# Patient Record
Sex: Female | Born: 1960
Health system: Southern US, Community
[De-identification: ages and names within clinical notes are randomized; demographics above are authoritative.]

## PROBLEM LIST (undated history)

## (undated) DIAGNOSIS — E079 Disorder of thyroid, unspecified: Secondary | ICD-10-CM

## (undated) DIAGNOSIS — L272 Dermatitis due to ingested food: Secondary | ICD-10-CM

## (undated) DIAGNOSIS — E785 Hyperlipidemia, unspecified: Secondary | ICD-10-CM

## (undated) DIAGNOSIS — F329 Major depressive disorder, single episode, unspecified: Secondary | ICD-10-CM

## (undated) DIAGNOSIS — F32A Depression, unspecified: Secondary | ICD-10-CM

## (undated) HISTORY — DX: Hyperlipidemia, unspecified: E78.5

## (undated) HISTORY — DX: Disorder of thyroid, unspecified: E07.9

## (undated) HISTORY — PX: TONSILLECTOMY AND ADENOIDECTOMY: SUR1326

## (undated) HISTORY — DX: Major depressive disorder, single episode, unspecified: F32.9

## (undated) HISTORY — DX: Depression, unspecified: F32.A

## (undated) HISTORY — DX: Dermatitis due to ingested food: L27.2

---

## 2003-11-24 ENCOUNTER — Other Ambulatory Visit: Admission: RE | Admit: 2003-11-24 | Discharge: 2003-11-24 | Payer: Self-pay | Admitting: Family Medicine

## 2008-01-29 LAB — CONVERTED CEMR LAB: Pap Smear: NORMAL

## 2008-07-21 ENCOUNTER — Encounter: Payer: Self-pay | Admitting: Family Medicine

## 2008-08-06 ENCOUNTER — Encounter: Payer: Self-pay | Admitting: Family Medicine

## 2008-08-10 ENCOUNTER — Encounter: Payer: Self-pay | Admitting: Internal Medicine

## 2008-12-02 ENCOUNTER — Encounter: Payer: Self-pay | Admitting: Family Medicine

## 2009-02-10 ENCOUNTER — Ambulatory Visit: Payer: Self-pay | Admitting: Family Medicine

## 2009-02-10 DIAGNOSIS — L272 Dermatitis due to ingested food: Secondary | ICD-10-CM | POA: Insufficient documentation

## 2009-02-10 DIAGNOSIS — E039 Hypothyroidism, unspecified: Secondary | ICD-10-CM | POA: Insufficient documentation

## 2009-02-14 ENCOUNTER — Encounter: Payer: Self-pay | Admitting: Family Medicine

## 2009-02-14 ENCOUNTER — Ambulatory Visit: Payer: Self-pay | Admitting: Family Medicine

## 2009-02-14 DIAGNOSIS — E559 Vitamin D deficiency, unspecified: Secondary | ICD-10-CM | POA: Insufficient documentation

## 2009-02-15 LAB — CONVERTED CEMR LAB
Alkaline Phosphatase: 52 units/L (ref 39–117)
Bilirubin, Direct: 0 mg/dL (ref 0.0–0.3)
Calcium: 8.8 mg/dL (ref 8.4–10.5)
Cholesterol: 231 mg/dL — ABNORMAL HIGH (ref 0–200)
Creatinine, Ser: 0.7 mg/dL (ref 0.4–1.2)
Direct LDL: 152.2 mg/dL
GFR calc non Af Amer: 94.41 mL/min (ref >60)
HDL: 44.6 mg/dL (ref >39.00)
Sodium: 138 meq/L (ref 135–145)
T4, Total: 6.7 ug/dL (ref 5.0–12.5)
Total Bilirubin: 0.6 mg/dL (ref 0.3–1.2)
Total CHOL/HDL Ratio: 5
Total Protein: 7.8 g/dL (ref 6.0–8.3)
Triglycerides: 190 mg/dL — ABNORMAL HIGH (ref 0.0–149.0)

## 2009-02-17 ENCOUNTER — Telehealth: Payer: Self-pay | Admitting: Family Medicine

## 2009-02-21 ENCOUNTER — Telehealth: Payer: Self-pay | Admitting: Family Medicine

## 2009-02-23 ENCOUNTER — Telehealth (INDEPENDENT_AMBULATORY_CARE_PROVIDER_SITE_OTHER): Payer: Self-pay | Admitting: *Deleted

## 2009-03-14 ENCOUNTER — Ambulatory Visit: Payer: Self-pay | Admitting: Internal Medicine

## 2009-03-14 DIAGNOSIS — E785 Hyperlipidemia, unspecified: Secondary | ICD-10-CM | POA: Insufficient documentation

## 2009-03-22 ENCOUNTER — Telehealth: Payer: Self-pay | Admitting: Internal Medicine

## 2009-03-23 LAB — CONVERTED CEMR LAB
Basophils Relative: 0.3 % (ref 0.0–3.0)
HCT: 39.9 % (ref 36.0–46.0)
Hemoglobin: 13.3 g/dL (ref 12.0–15.0)
Lymphocytes Relative: 26.6 % (ref 12.0–46.0)
Lymphs Abs: 1.9 10*3/uL (ref 0.7–4.0)
MCHC: 33.2 g/dL (ref 30.0–36.0)
Monocytes Relative: 7.1 % (ref 3.0–12.0)
Neutro Abs: 4.7 10*3/uL (ref 1.4–7.7)
RBC: 4.54 M/uL (ref 3.87–5.11)
RDW: 12.9 % (ref 11.5–14.6)

## 2009-04-13 ENCOUNTER — Encounter: Payer: Self-pay | Admitting: Family Medicine

## 2009-07-11 ENCOUNTER — Ambulatory Visit: Payer: Self-pay | Admitting: Family Medicine

## 2009-08-08 ENCOUNTER — Ambulatory Visit: Payer: Self-pay | Admitting: Family Medicine

## 2009-08-08 LAB — CONVERTED CEMR LAB: TSH: 1.89 microintl units/mL (ref 0.35–5.50)

## 2009-08-09 LAB — CONVERTED CEMR LAB: Vit D, 25-Hydroxy: 24 ng/mL — ABNORMAL LOW (ref 30–89)

## 2009-09-21 ENCOUNTER — Ambulatory Visit: Payer: Self-pay | Admitting: Family Medicine

## 2009-09-26 ENCOUNTER — Telehealth: Payer: Self-pay | Admitting: Family Medicine

## 2009-10-25 ENCOUNTER — Ambulatory Visit: Payer: Self-pay | Admitting: Family Medicine

## 2009-10-28 LAB — CONVERTED CEMR LAB: Vit D, 25-Hydroxy: 29 ng/mL — ABNORMAL LOW (ref 30–89)

## 2009-12-07 ENCOUNTER — Encounter (INDEPENDENT_AMBULATORY_CARE_PROVIDER_SITE_OTHER): Payer: Self-pay | Admitting: *Deleted

## 2009-12-07 ENCOUNTER — Ambulatory Visit: Payer: Self-pay | Admitting: Family Medicine

## 2009-12-07 ENCOUNTER — Other Ambulatory Visit: Admission: RE | Admit: 2009-12-07 | Discharge: 2009-12-07 | Payer: Self-pay | Admitting: Family Medicine

## 2009-12-07 LAB — HM PAP SMEAR

## 2009-12-08 ENCOUNTER — Encounter: Payer: Self-pay | Admitting: Family Medicine

## 2009-12-08 LAB — CONVERTED CEMR LAB
ALT: 27 units/L (ref 0–35)
AST: 20 units/L (ref 0–37)
Alkaline Phosphatase: 56 units/L (ref 39–117)
BUN: 9 mg/dL (ref 6–23)
Bilirubin, Direct: 0 mg/dL (ref 0.0–0.3)
Calcium: 9 mg/dL (ref 8.4–10.5)
GFR calc non Af Amer: 81.85 mL/min (ref >60)
Glucose, Bld: 112 mg/dL — ABNORMAL HIGH (ref 70–99)
Total Bilirubin: 0.4 mg/dL (ref 0.3–1.2)

## 2009-12-15 ENCOUNTER — Encounter: Payer: Self-pay | Admitting: Family Medicine

## 2009-12-28 ENCOUNTER — Telehealth: Payer: Self-pay | Admitting: Family Medicine

## 2010-01-16 ENCOUNTER — Encounter (INDEPENDENT_AMBULATORY_CARE_PROVIDER_SITE_OTHER): Payer: Self-pay | Admitting: *Deleted

## 2010-01-17 ENCOUNTER — Ambulatory Visit: Payer: Self-pay | Admitting: Internal Medicine

## 2010-01-31 ENCOUNTER — Ambulatory Visit: Payer: Self-pay | Admitting: Internal Medicine

## 2010-02-03 LAB — HM MAMMOGRAPHY: HM Mammogram: NORMAL

## 2010-02-06 ENCOUNTER — Encounter: Payer: Self-pay | Admitting: Family Medicine

## 2010-03-21 NOTE — Letter (Signed)
Summary: Results Follow up Letter  [facility]  [address] [place], [place] [address]   Phone: [phone]  Fax: [phone]    12/15/2009 MRN: [id number]  [facility] [address] [place], [place]  [address]  Dear Ms. Leandro Reasoner,  The following are the results of your recent test(s):  Test         Result    Pap Smear:        Normal __X___  Not Normal _____ Comments: ______________________________________________________ Cholesterol: LDL(Bad cholesterol):         Your goal is less than:         HDL (Good cholesterol):       Your goal is more than: Comments:  ______________________________________________________ Mammogram:        Normal _____  Not Normal _____ Comments:  ___________________________________________________________________ Hemoccult:        Normal _____  Not normal _______ Comments:    _____________________________________________________________________ Other Tests:    We routinely do not discuss normal results over the telephone.  If you desire a copy of the results, or you have any questions about this information we can discuss them at your next office visit.   Sincerely,     Linde Gillis, CMA (AAMA)for Dr. Ruthe Mannan

## 2010-03-21 NOTE — Assessment & Plan Note (Signed)
Summary: thyroid medication/dlo   Vital Signs:  Patient profile:   50 year old female Height:      66 inches Weight:      224.13 pounds BMI:     36.31 Temp:     98.1 degrees F oral Pulse rate:   68 / minute Pulse rhythm:   regular BP sitting:   118 / 72  (left arm) Cuff size:   large  Vitals Entered By: Linde Gillis CMA Duncan Dull) (Jul 11, 2009 12:00 PM) CC: thyroid medication   History of Present Illness: 50 yo here for hypothyroidism.  Hypothyroidism- had been on Armour 75 mg for years, then on Armour 30 mg daily. Referred to endocrinology, Dr. Sharl Ma. He placed her on Synthroid 50 micrograms daily.   Since starting the Synthroid, feels terrible. Per pt, told Dr. Sharl Ma but he said her blood work was normal and did not want to increase dose. Currently fatigued, constipated, cold, feels her skin is dry.    Current Medications (verified): 1)  Vitamin D3 10000 Unit Caps (Cholecalciferol) .Marland Kitchen.. 1 Tab By Mouth Daily. 2)  Armour Thyroid 30 Mg Tabs (Thyroid) .... Take 1 By Mouth Once Daily 3)  Fexofenadine Hcl 180 Mg Tabs (Fexofenadine Hcl) .Marland Kitchen.. 1 Daily As Needed Antihistamine 4)  Zantac 25 Mg/ml Soln (Ranitidine Hcl) .... Take One Tablet By Mouth As Needed For Heatburn  Allergies: 1)  ! Penicillin V Potassium (Penicillin V Potassium) 2)  ! Steroids  Review of Systems      See HPI General:  Complains of malaise. CV:  Denies chest pain or discomfort. Resp:  Denies shortness of breath. GI:  Complains of constipation.  Physical Exam  General:  Well-developed,well-nourished,in no acute distress; alert,appropriate and cooperative throughout examination Lungs:  Normal respiratory effort, chest expands symmetrically. Lungs are clear to auscultation, no crackles or wheezes. Heart:  Normal rate and regular rhythm. S1 and S2 normal without gallop, murmur, click, rub or other extra sounds. Psych:  normally interactive and good eye contact.     Impression & Recommendations:  Problem #  1:  HYPOTHYROIDISM (ICD-244.9) Assessment Deteriorated Time spent with patient 25 minutes, more than 50% of this time was spent counseling patient on hypothyroidism. Will restart Armour, see pt instructions for details. Follow up with me in one month, will check thyroid funciton at that time.  Her updated medication list for this problem includes:    Armour Thyroid 30 Mg Tabs (Thyroid) .Marland Kitchen... Take 1 by mouth once daily  Complete Medication List: 1)  Vitamin D3 10000 Unit Caps (Cholecalciferol) .Marland Kitchen.. 1 tab by mouth daily. 2)  Armour Thyroid 30 Mg Tabs (Thyroid) .... Take 1 by mouth once daily 3)  Fexofenadine Hcl 180 Mg Tabs (Fexofenadine hcl) .Marland Kitchen.. 1 daily as needed antihistamine 4)  Zantac 25 Mg/ml Soln (Ranitidine hcl) .... Take one tablet by mouth as needed for heatburn  Patient Instructions: 1)  Let's taper synthroid- 1 tab every other day for 2-3 weeks with the  armour. 2)  come back in one month to recheck and symptoms. Prescriptions: ARMOUR THYROID 30 MG TABS (THYROID) take 1 by mouth once daily  #30 x 12   Entered and Authorized by:   Ruthe Mannan MD   Signed by:   Ruthe Mannan MD on 07/11/2009   Method used:   Electronically to        [id number]* (retail)       [address]       [place],  [place]  [address]       Ph: [phone]       Fax: [phone]   RxID:   [id number]   Current Allergies (reviewed today): ! PENICILLIN V POTASSIUM (PENICILLIN V POTASSIUM) ! STEROIDS

## 2010-03-21 NOTE — Letter (Signed)
Summary: Pre Visit Letter Revised  [facility]  [address] [place], [place] [address]   Phone: [phone]  Fax: [phone]        12/07/2009 MRN: [id number] [facility] [address] [place], [place]  [address]             Procedure Date:  01-31-10   Welcome to the Gastroenterology Division at [facility].    You are scheduled to see a nurse for your pre-procedure visit on 01-17-10 at 10:00a.m. on the 3rd floor at [facility], [address]  We ask that you try to arrive at our office 15 minutes prior to your appointment time to allow for check-in.  Please take a minute to review the attached form.  If you answer "Yes" to one or more of the questions on the first page, we ask that you call the person listed at your earliest opportunity.  If you answer "No" to all of the questions, please complete the rest of the form and bring it to your appointment.    Your nurse visit will consist of discussing your medical and surgical history, your immediate family medical history, and your medications.   If you are unable to list all of your medications on the form, please bring the medication bottles to your appointment and we will list them.  We will need to be aware of both prescribed and over the counter drugs.  We will need to know exact dosage information as well.    Please be prepared to read and sign documents such as consent forms, a financial agreement, and acknowledgement forms.  If necessary, and with your consent, a friend or relative is welcome to sit-in on the nurse visit with you.  Please bring your insurance card so that we may make a copy of it.  If your insurance requires a referral to see a specialist, please bring your referral form from your primary care physician.  No co-pay is required for this nurse visit.     If you cannot keep your appointment, please call [phone] to cancel or reschedule prior to your appointment date.  This  allows [place] the opportunity to schedule an appointment for another patient in need of care.    Thank you for choosing [facility] for your medical needs.  We appreciate the opportunity to care for you.  Please visit [place] at our website  to learn more about our practice.  Sincerely, The Gastroenterology Division

## 2010-03-21 NOTE — Miscellaneous (Signed)
Summary: [facility]  Clinical Lists Changes  Medications: Added new medication of MIRALAX   POWD (POLYETHYLENE GLYCOL 3350) As per prep  instructions. - Signed Added new medication of DULCOLAX 5 MG  TBEC (BISACODYL) Day before procedure take 2 at 3pm and 2 at 8pm. - Signed Added new medication of REGLAN 10 MG  TABS (METOCLOPRAMIDE HCL) As per prep instructions. - Signed Rx of MIRALAX   POWD (POLYETHYLENE GLYCOL 3350) As per prep  instructions.;  #255gm x 0;  Signed;  Entered by: Ezra Sites RN;  Authorized by: Hart Carwin MD;  Method used: Electronically to [facility] # [address], [place], [place]  [address], Ph: [phone], Fax: [phone] Rx of DULCOLAX 5 MG  TBEC (BISACODYL) Day before procedure take 2 at 3pm and 2 at 8pm.;  #4 x 0;  Signed;  Entered by: Ezra Sites RN;  Authorized by: Hart Carwin MD;  Method used: Electronically to [facility] # [address]*, [address], [place], [place]  [address], Ph: [phone], Fax: [phone] Rx of REGLAN 10 MG  TABS (METOCLOPRAMIDE HCL) As per prep instructions.;  #2 x 0;  Signed;  Entered by: Ezra Sites RN;  Authorized by: Hart Carwin MD;  Method used: Electronically to [facility] # [address], [place], [place]  [address], Ph: [phone], Fax: [phone] Allergies: Changed allergy or adverse reaction from PENICILLIN V POTASSIUM (PENICILLIN V POTASSIUM) to PENICILLIN V POTASSIUM (PENICILLIN V POTASSIUM) Changed allergy or adverse reaction from STEROIDS to STEROIDS    Prescriptions: REGLAN 10 MG  TABS (METOCLOPRAMIDE HCL) As per prep instructions.  #2 x 0   Entered by:   Ezra Sites RN   Authorized by:   Hart Carwin MD   Signed by:   Ezra Sites RN on 01/17/2010   Method used:   Electronically to        [facility] [address]* (retail)       [address]       [place], [place]  [address]       Ph: [phone]       Fax: [phone]   RxID:    [id number] DULCOLAX 5 MG  TBEC (BISACODYL) Day before procedure take 2 at 3pm and 2 at 8pm.  #4 x 0   Entered by:   Ezra Sites RN   Authorized by:   Hart Carwin MD   Signed by:   Ezra Sites RN on 01/17/2010   Method used:   Electronically to        [id number]* (retail)       [address]       [place], [place]  [address]       Ph: [phone]       Fax: [phone]   RxID:   [id number] MIRALAX   POWD (POLYETHYLENE GLYCOL 3350) As per prep  instructions.  #255gm x 0   Entered by:   Ezra Sites RN   Authorized by:   Hart Carwin MD   Signed by:   Ezra Sites RN on 01/17/2010   Method used:   Electronically to        [facility] # [address]* (retail)       [address]       [place], [place]  [address]       Ph: [phone]       Fax: [phone]   RxID:  [id number]  

## 2010-03-21 NOTE — Assessment & Plan Note (Signed)
Summary: allergy problem   Vital Signs:  Patient profile:   50 year old female Height:      66 inches Weight:      229.38 pounds BMI:     37.16 O2 Sat:      98 % on Room air Pulse rate:   75 / minute BP sitting:   122 / 68  (left arm) Cuff size:   large  Vitals Entered By: Gweneth Dimitri RN (March 14, 2009 3:36 PM)  O2 Flow:  Room air CC: Allergy Consult. Comments Medications reviewed with patient Daytime contact number verified with patient. Gweneth Dimitri RN  March 14, 2009 3:36 PM    Copy to:  Dr. Dayton Martes Primary Provider/Referring Provider:  Dr. Dayton Martes  CC:  Allergy Consult.Marland Kitchen  History of Present Illness: March 14, 2009- 50 yoF seen in allergy referral at kind request of Dr Dayton Martes, concerned about food intolerances. In the past year for the first time, she has had several episodes of urticaria with swelling of lips. She has had allergy skin testing twice, by Dr Lucie Leather and Dr Yisroel Ramming. These showed reaction to several common inhalants. Testing last June showed positive responses to corn, almond, peanut, cantaloupe, cotton seed and rye. Allegra helps. She is concerned, despite prior testing, that something is being missed. She understands the distinction between allergy and intolerance. She feels better if she avoids wheat, but says test for Celiac was neg. Also soy maakes her "tired". She has tried a food diary. She denies significant rhinitis from pollens or dust. Sensitive to strong odors. Hx of depression.  Denies unusual responses to latex, contrast dye, insect stings or aspirin.  Preventive Screening-Counseling & Management  Alcohol-Tobacco     Smoking Status: quit  Current Medications (verified): 1)  Prilosec Otc 20 Mg Tbec (Omeprazole Magnesium) .... One By Mouth Daily 2)  Vitamin D3 10000 Unit Caps (Cholecalciferol) .Marland Kitchen.. 1 Tab By Mouth Daily. 3)  Armour Thyroid 30 Mg Tabs (Thyroid) .... Take 1 By Mouth Once Daily  Allergies (verified): 1)  ! Penicillin V Potassium  (Penicillin V Potassium) 2)  ! 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  Quit in 2007.  1ppd x 30 yrs.  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  Quit in 2007.  1ppd x 30 yrs.  alcohol 2 drinks/month Smoking Status:  quit  Review of Systems      See HPI  The patient denies shortness of breath with activity, shortness of breath at rest, productive cough, non-productive cough, coughing up blood, chest pain, irregular heartbeats, acid heartburn, indigestion, loss of appetite, weight change, abdominal pain, difficulty swallowing, sore throat, tooth/dental problems, headaches, nasal congestion/difficulty breathing through nose, sneezing, itching, ear ache, anxiety, depression, hand/feet swelling, joint stiffness or pain, rash, change in color of mucus, and fever.    Physical Exam  Additional Exam:  General: A/Ox3; pleasant and cooperative, NAD, overweight SKIN: no  rash, lesions NODES: no lymphadenopathy HEENT: [place]/AT, EOM- WNL, Conjuctivae- clear, PERRLA, TM-WNL, Nose- clear, Throat- clear and wnl, Vocal tremor, NECK: Supple w/ fair ROM, JVD- none, normal carotid impulses w/o bruits Thyroid- normal to palpation CHEST: Clear to P&A HEART: RRR, no  m/g/r heard ABDOMEN: Soft and nl; nml bowel sounds; no organomegaly or masses noted EAV:WUJW, nl pulses, no edema  NEURO: Grossly intact to observation      Impression & Recommendations:  Problem # 1:  ALLERGY, FOOD (ICD-693.1) She has had a few episodes of mild urticaria/ angioedema without compelling relationship to foods. She is convinced that foods, especially wheat, have been causing hives and lip swelling, with little GI distress beyond what may  be mild irritable bowel. Thyroid w/u is pending. We will evaluate food sensitivity with IgE, IgG and celiac evaluation. I have re-emphazsized use of food diary, antihistamines and avoidance of foods she most suspects.  Medications Added to Medication List This Visit: 1)  Fexofenadine Hcl 180 Mg Tabs (Fexofenadine hcl) .Marland Kitchen.. 1 daily as needed antihistamine  Other Orders: Consultation Level III (11914) TLB-CBC Platelet - w/Differential (85025-CBCD) T- * Misc. Laboratory test [id number])  Patient Instructions: 1)  Please schedule a follow-up appointment in 3 weeks. 2)  Lab 3)  Avoid foods that seem to make you sick 4)  It is ok to pretreat with an antihistamine before going out or when eating foods you aren't sure about. 5)  Try to keep a food diary. 6)  Script for fexofenadine 180 Prescriptions: FEXOFENADINE HCL 180 MG TABS (FEXOFENADINE HCL) 1 daily as needed antihistamine  #30 x prn   Entered and Authorized by:   Waymon Budge MD   Signed by:   Waymon Budge MD on 03/14/2009   Method used:   Print then Give to Patient   RxID:   [id number]

## 2010-03-21 NOTE — Miscellaneous (Signed)
Summary: Skin Test/[facility]  [facility]   Imported By: Sherian Rein 03/25/2009 07:31:32  _____________________________________________________________________  External Attachment:    Type:   Image     Comment:   External Document

## 2010-03-21 NOTE — Consult Note (Signed)
Summary: [facility]   Imported By: Lanelle Bal 05/03/2009 08:27:26  _____________________________________________________________________  External Attachment:    Type:   Image     Comment:   External Document

## 2010-03-21 NOTE — Letter (Signed)
Summary: Generic Letter  [facility]  [address] [place], [place] [address]   Phone: [phone]  Fax: [phone]    12/08/2009  KATERINA ZURN [address] [place], [place]  [address]  Dear Ms. Leandro Reasoner,     We have received your lab results and Dr. Dayton Martes says that overall your labs look good.  Thyroid function good.  Good cholesterol, (HDL) and triglycerides are good but bad cholesterol, (LDL) is high.  Decrease added sugars, eliminate trans fats, increase fiber.  If you feel you cannot make too many dietary changes due to your sensitive GI tract, we will need to start cholesterol medication.       Sincerely,        Linde Gillis CMA (AAMA)for Dr. Ruthe Mannan

## 2010-03-21 NOTE — Progress Notes (Signed)
Summary: pt requests phone call  Phone Note Call from Patient Call back at [phone]   Caller: Patient Call For: Ruthe Mannan MD Summary of Call: Pt is  asking that you call her regarding her vitamin d level.  She is concerned that it didnt come up from june till now. Initial call taken by: Lowella Petties CMA,  September 26, 2009 9:46 AM  Follow-up for Phone Call        Discussed with pt reasons for difficulty absorbing vitamin d. Ruthe Mannan MD  September 26, 2009 9:53 AM     New/Updated Medications: ARMOUR THYROID 60 MG TABS (THYROID) 1 tab by mouth daily Prescriptions: ARMOUR THYROID 60 MG TABS (THYROID) 1 tab by mouth daily  #30 x 0   Entered and Authorized by:   Ruthe Mannan MD   Signed by:   Ruthe Mannan MD on 09/26/2009   Method used:   Electronically to        [facility] # [address] (retail)       [address]       [place], [place]  [address]       Ph: [phone]       Fax: [phone]   RxID:   [id number]

## 2010-03-21 NOTE — Progress Notes (Signed)
Summary: ? about Vit D3 rx  Phone Note Call from Patient Call back at [phone]   Caller: Patient Call For: Ruthe Mannan MD Summary of Call: [facility] [address] [facility] told did not have [facility] her. I [facility] [address] and spoke with Leah and she said they did receive the rx for Vit D3 10,000 units. [facility] and [facility] with [facility]. Initial call taken by: Lewanda Rife LPN,  February 21, 2009 1:09 PM

## 2010-03-21 NOTE — Assessment & Plan Note (Signed)
Summary: 1 M F/U DLO   Vital Signs:  Patient profile:   50 year old female Height:      66 inches Weight:      220.38 pounds BMI:     35.70 Temp:     97.8 degrees F oral Pulse rate:   68 / minute Pulse rhythm:   regular BP sitting:   126 / 86  (left arm) Cuff size:   large  Vitals Entered By: Linde Gillis CMA Duncan Dull) (August 08, 2009 11:52 AM) CC: one month follow up    History of Present Illness: 50 yo here for hypothyroidism.  Hypothyroidism- had been on Armour 75 mg for years, then on Armour 30 mg daily. Referred to endocrinology, Dr. Sharl Ma. He placed her on Synthroid 50 micrograms daily.   Since starting the Synthroid, feels terrible. Per pt, told Dr. Sharl Ma but he said her blood work was normal and did not want to increase dose. Last month, we restarted her Armour 30 mg daily and stopped the Synthroid. She does feel much better but still is tired and has dry skin.  Hair continues to fall out although not as rapidly.    Current Medications (verified): 1)  Vitamin D3 10000 Unit Caps (Cholecalciferol) .Marland Kitchen.. 1 Tab By Mouth Daily. 2)  Armour Thyroid 30 Mg Tabs (Thyroid) .... Take 1 By Mouth Once Daily 3)  Fexofenadine Hcl 180 Mg Tabs (Fexofenadine Hcl) .Marland Kitchen.. 1 Daily As Needed Antihistamine 4)  Zantac 25 Mg/ml Soln (Ranitidine Hcl) .... Take One Tablet By Mouth As Needed For Heatburn  Allergies: 1)  ! Penicillin V Potassium (Penicillin V Potassium) 2)  ! Steroids  Past History:  Past Medical History: Last updated: 03/14/2009 Hypothyroidism : HYPERLIPIDEMIA (ICD-272.4) VITAMIN D DEFICIENCY (ICD-268.9) ROUTINE GENERAL MEDICAL EXAM@HEALTH  CARE FACL (ICD-V70.0) SCREENING FOR LIPOID DISORDERS (ICD-V77.91) HYPOTHYROIDISM (ICD-244.9) UNSPECIFIED HYPOTHYROIDISM (ICD-244.9) ALLERGY, FOOD (ICD-693.1) Depression  Past Surgical History: Last updated: 03/14/2009 tonsils  Family History: Last updated: 02-22-09 Mom -alive, 36, HLD Dad died of lung CA, had MI at age  87  Social History: Last updated: 03/14/2009 Lives with husband in [place], no children She is a Veterinary surgeon, 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  Quit in 2007.  1ppd x 30 yrs.  alcohol 2 drinks/month  Risk Factors: Smoking Status: quit (03/14/2009)  Review of Systems      See HPI General:  Complains of fatigue. ENT:  Denies difficulty swallowing. CV:  Denies chest pain or discomfort. Resp:  Denies shortness of breath. GI:  Denies abdominal pain and change in bowel habits. Derm:  Complains of dryness and hair loss. Psych:  Denies anxiety and depression.  Physical Exam  General:  Well-developed,well-nourished,in no acute distress; alert,appropriate and cooperative throughout examination Mouth:  Oral mucosa and oropharynx without lesions or exudates.  Teeth in good repair. Neck:  No deformities, masses, or tenderness noted. Lungs:  Normal respiratory effort, chest expands symmetrically. Lungs are clear to auscultation, no crackles or wheezes. Heart:  Normal rate and regular rhythm. S1 and S2 normal without gallop, murmur, click, rub or other extra sounds. Extremities:  No clubbing, cyanosis, edema, or deformity noted with normal full range of motion of all joints.   Psych:  normally interactive and good eye contact.     Impression & Recommendations:  Problem # 1:  HYPOTHYROIDISM (ICD-244.9) Assessment Unchanged Time spent with patient 25 minutes, more than 50% of this time was spent counseling patient on hypothyroidism. Will recheck TSH today although we need to manage her symptoms  mainly since her labs tend to be normal. Also, she does have a h/o Vit D deficiency.  Will recheck Vit D.  May need short course of higher dose Vit D supplementation. Pt in agreement with plan.  Her updated medication list for this problem includes:    Armour Thyroid 30 Mg Tabs (Thyroid) .Marland Kitchen... Take 1 by mouth once daily  Orders: Venipuncture (42595) TLB-TSH (Thyroid Stimulating  Hormone) (84443-TSH)  Complete Medication List: 1)  Vitamin D3 10000 Unit Caps (Cholecalciferol) .Marland Kitchen.. 1 tab by mouth daily. 2)  Armour Thyroid 30 Mg Tabs (Thyroid) .... Take 1 by mouth once daily 3)  Fexofenadine Hcl 180 Mg Tabs (Fexofenadine hcl) .Marland Kitchen.. 1 daily as needed antihistamine 4)  Zantac 25 Mg/ml Soln (Ranitidine hcl) .... Take one tablet by mouth as needed for heatburn  Other Orders: T-Vitamin D (25-Hydroxy) ([id number])  Current Allergies (reviewed today): ! PENICILLIN V POTASSIUM (PENICILLIN V POTASSIUM) ! STEROIDS  Appended Document: 1 M F/U DLO

## 2010-03-21 NOTE — Progress Notes (Signed)
Summary: Referral  Phone Note Call from Patient Call back at [phone]   Caller: Patient Call For: Tiffany Schmidt Summary of Call: pt would like to know if her Endocrinology appt has been set up? Initial call taken by: Mervin Hack CMA Duncan Dull),  February 23, 2009 2:24 PM  Follow-up for Phone Call        I see in pt's chart that it was done and sent, but is there a date or should pt just wait for that office to call her? DeShannon Katrinka Blazing CMA Duncan Dull)  February 23, 2009 2:24 PM   Additional Follow-up for Phone Call Additional follow up Details #1::        Sp w/ Pt regarding appt.Marland KitchenMarland KitchenPer Dr. Dayton Martes would like pt scheduled w/ Dr. Sharl Ma.Tiffany Schmidt  February 24, 2009 2:16 PM  Additional Follow-up by: Tiffany Schmidt,  February 24, 2009 2:16 PM    New/Updated Medications: ARMOUR THYROID 30 MG TABS (THYROID) take 1 by mouth once daily Prescriptions: ARMOUR THYROID 30 MG TABS (THYROID) take 1 by mouth once daily  #30 x 12   Entered by:   Mervin Hack CMA (AAMA)   Authorized by:   Ruthe Mannan MD   Signed by:   Mervin Hack CMA (AAMA) on 02/23/2009   Method used:   Electronically to        [facility] # [address] (retail)       [address]       [place], [place]  [address]       Ph: [phone]       Fax: [phone]   RxID:   [id number]

## 2010-03-21 NOTE — Progress Notes (Signed)
Summary: regarding armour thyroid  Phone Note Call from Patient Call back at [phone]   Caller: Patient Call For: Tiffany Mannan MD Summary of Call: Pt called complaining of headache, sore throat, low grade fever since this morning.  She wanted script for tamiflu but I told her she would need to be seen first.  She said she will see how she feels tomorrow and will call back if not better.  Also, she said that the last time she got her armour thyroid refill it was for 30 mg's and she has been taking 60 mg's everyday for 2 weeks and feels fine.  Please advise on what dose she is to be on. Initial call taken by: Lowella Petties CMA, AAMA,  December 28, 2009 3:18 PM  Follow-up for Phone Call        Is she asking if we can increase to 60 mg?  I am ok with that. Tiffany Mannan MD  December 29, 2009 7:50 AM  Spoke with patient, she is feeling a little better and I verified her armour thyroid dose, she is taking 60mg  once daily.  I will send in a new Rx so she won't run out before the end of the month and so she won't have to take but one tablet daily. Follow-up by: Linde Gillis CMA Duncan Dull),  December 29, 2009 2:23 PM    New/Updated Medications: ARMOUR THYROID 60 MG TABS (THYROID) take one tablet by mouth daily Prescriptions: ARMOUR THYROID 60 MG TABS (THYROID) take one tablet by mouth daily  #30 x 11   Entered by:   Linde Gillis CMA (AAMA)   Authorized by:   Tiffany Mannan MD   Signed by:   Linde Gillis CMA (AAMA) on 12/29/2009   Method used:   Electronically to        [facility] # [address] (retail)       [address]       [place], [place]  [address]       Ph: [phone]       Fax: [phone]   RxID:   [id number]   Current Allergies (reviewed today): ! PENICILLIN V POTASSIUM (PENICILLIN V POTASSIUM) ! STEROIDS

## 2010-03-21 NOTE — Letter (Signed)
Summary: [facility] Instructions  [facility]  [address] [place], [place] [address]   Phone: [phone]  Fax: [phone]       Tiffany Schmidt    02-28-1960    MRN: [id number]       Procedure Day /Date: Tuesday 01-31-10     Arrival Time:  8:00 a.m.     Procedure Time: 9:00 a.m.     Location of Procedure:                    _x _  [facility] (4th Floor)   PREPARATION FOR COLONOSCOPY WITH MIRALAX  Starting 5 days prior to your procedure  01-26-10 do not eat nuts, seeds, popcorn, corn, beans, peas,  salads, or any raw vegetables.  Do not take any fiber supplements (e.g. Metamucil, Citrucel, and Benefiber). ____________________________________________________________________________________________________   THE DAY BEFORE YOUR PROCEDURE         DATE:  01-30-10  DAY:  Monday  1   Drink clear liquids the entire day-NO SOLID FOOD  2   Do not drink anything colored red or purple.  Avoid juices with pulp.  No orange juice.  3   Drink at least 64 oz. (8 glasses) of fluid/clear liquids during the day to prevent dehydration and help the prep work efficiently.  CLEAR LIQUIDS INCLUDE: Water Jello Ice Popsicles Tea (sugar ok, no milk/cream) Powdered fruit flavored drinks Coffee (sugar ok, no milk/cream) Gatorade Juice: apple, white grape, white cranberry  Lemonade Clear bullion, consomm, broth Carbonated beverages (any kind) Strained chicken noodle soup Hard Candy  4   Mix the entire bottle of Miralax with 64 oz. of Gatorade/Powerade in the morning and put in the refrigerator to chill.  5   At 3:00 pm take 2 Dulcolax/Bisacodyl tablets.  6   At 4:30 pm take one Reglan/Metoclopramide tablet.  7  Starting at 5:00 pm drink one 8 oz glass of the Miralax mixture every 15-20 minutes until you have finished drinking the entire 64 oz.  You should finish drinking prep around 7:30 or 8:00 pm.  8   If you are nauseated, you may take the 2nd  Reglan/Metoclopramide tablet at 6:30 pm.        9    At 8:00 pm take 2 more DULCOLAX/Bisacodyl tablets.     THE DAY OF YOUR PROCEDURE      DATE:   01-31-10   DAY:  Tuesday  You may drink clear liquids until  7:00 a.m.  (2 HOURS BEFORE PROCEDURE).   MEDICATION INSTRUCTIONS  Unless otherwise instructed, you should take regular prescription medications with a small sip of water as early as possible the morning of your procedure.          OTHER INSTRUCTIONS  You will need a responsible adult at least 51 years of age to accompany you and drive you home.   This person must remain in the waiting room during your procedure.  Wear loose fitting clothing that is easily removed.  Leave jewelry and other valuables at home.  However, you may wish to bring a book to read or an iPod/MP3 player to listen to music as you wait for your procedure to start.  Remove all body piercing jewelry and leave at home.  Total time from sign-in until discharge is approximately 2-3 hours.  You should go home directly after your procedure and rest.  You can resume normal activities the day after your procedure.  The day of your  procedure you should not:   Drive   Make legal decisions   Operate machinery   Drink alcohol   Return to work  You will receive specific instructions about eating, activities and medications before you leave.   The above instructions have been reviewed and explained to me by   Ezra Sites RN  January 17, 2010 10:16 AM     I fully understand and can verbalize these instructions _____________________________ Date _______

## 2010-03-21 NOTE — Miscellaneous (Signed)
Summary: [facility]   Imported By: Lester Spencer 04/07/2009 07:14:05  _____________________________________________________________________  External Attachment:    Type:   Image     Comment:   External Document

## 2010-03-21 NOTE — Progress Notes (Signed)
Summary: returning call to katie/cb  Phone Note Call from Patient Call back at Home Phone [phone]   Caller: Patient Call For: young Summary of Call: returning call to katie/cb Initial call taken by: Lacinda Axon,  March 22, 2009 10:25 AM  Follow-up for Phone Call        [facility]. see append for more information.Reynaldo Minium CMA  March 22, 2009 1:44 PM

## 2010-03-21 NOTE — Assessment & Plan Note (Signed)
Summary: CPX/CLE   Vital Signs:  Patient profile:   50 year old female Height:      66 inches Weight:      222 pounds BMI:     35.96 Temp:     97.6 degrees F oral Pulse rate:   72 / minute Pulse rhythm:   regular BP sitting:   122 / 80  (left arm) Cuff size:   large  Vitals Entered By: Linde Gillis CMA Duncan Dull) (December 07, 2009 8:20 AM) CC: complete physicial, with pap, refused flu shot   History of Present Illness: 50 yo here for CPX.  Hypothyroidism- had been on Armour 75 mg for years, then on Armour 30 mg daily. Referred to endocrinology, Dr. Sharl Ma. He placed her on Synthroid 50 micrograms daily which made her feel awful and shaky.  She still feels that way a little but its better.  Thinks she is starting menopause.  Has not had a period in 4 months, is having hot flashes at night.  Accupuncture is helping.     Current Medications (verified): 1)  Vitamin D (Ergocalciferol) 50000 Unit Caps (Ergocalciferol) .... Take One Tablet By Mouth Once A Week For Six Weeks 2)  Armour Thyroid 30 Mg Tabs (Thyroid) .... Take 1 By Mouth Once Daily 3)  Fexofenadine Hcl 180 Mg Tabs (Fexofenadine Hcl) .Marland Kitchen.. 1 Daily As Needed Antihistamine 4)  Zantac 25 Mg/ml Soln (Ranitidine Hcl) .... Take One Tablet By Mouth As Needed For Heatburn  Allergies: 1)  ! Penicillin V Potassium (Penicillin V Potassium) 2)  ! Steroids  Past History:  Past Medical History: Last updated: 03/14/2009 Hypothyroidism : HYPERLIPIDEMIA (ICD-272.4) VITAMIN D DEFICIENCY (ICD-268.9) ROUTINE GENERAL MEDICAL EXAM@HEALTH  CARE FACL (ICD-V70.0) SCREENING FOR LIPOID DISORDERS (ICD-V77.91) HYPOTHYROIDISM (ICD-244.9) UNSPECIFIED HYPOTHYROIDISM (ICD-244.9) ALLERGY, FOOD (ICD-693.1) Depression  Past Surgical History: Last updated: 03/14/2009 tonsils  Family History: Last updated: 02-12-2009 Mom -alive, 61, HLD Dad died of lung CA, had MI at age 49  Social History: Last updated: 03/14/2009 Lives with husband in  [place], no children She is a Veterinary surgeon, 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  Quit in 2007.  1ppd x 30 yrs.  alcohol 2 drinks/month  Risk Factors: Smoking Status: quit (03/14/2009)  Review of Systems      See HPI General:  Denies malaise. Eyes:  Denies blurring. ENT:  Denies difficulty swallowing. CV:  Denies chest pain or discomfort. Resp:  Denies shortness of breath. GI:  Denies abdominal pain, bloody stools, and change in bowel habits. GU:  Denies abnormal vaginal bleeding, discharge, and dysuria. MS:  Denies joint pain, joint redness, and joint swelling. Derm:  Denies rash. Neuro:  Denies headaches. Psych:  Denies suicidal thoughts/plans, thoughts of violence, unusual visions or sounds, and thoughts /plans of harming others. Endo:  Denies cold intolerance and heat intolerance. Heme:  Denies abnormal bruising and bleeding.  Physical Exam  General:  Well-developed,well-nourished,in no acute distress; alert,appropriate and cooperative throughout examination Head:  normocephalic and atraumatic.   Eyes:  No corneal or conjunctival inflammation noted. EOMI. Perrla. Funduscopic exam benign, without hemorrhages, exudates or papilledema. Vision grossly normal. Ears:  External ear exam shows no significant lesions or deformities.  Otoscopic examination reveals clear canals, tympanic membranes are intact bilaterally without bulging, retraction, inflammation or discharge. Hearing is grossly normal bilaterally. Nose:  External nasal examination shows no deformity or inflammation. Nasal mucosa are pink and moist without lesions or exudates. Mouth:  Oral mucosa and oropharynx without lesions or exudates.  Teeth in good repair.  Neck:  No deformities, masses, or tenderness noted. Breasts:  No mass, nodules, thickening, tenderness, bulging, retraction, inflamation, nipple discharge or skin changes noted.   Lungs:  Normal respiratory effort, chest expands symmetrically. Lungs are  clear to auscultation, no crackles or wheezes. Heart:  Normal rate and regular rhythm. S1 and S2 normal without gallop, murmur, click, rub or other extra sounds. Abdomen:  Bowel sounds positive,abdomen soft and non-tender without masses, organomegaly or hernias noted. Rectal:  no external abnormalities and no hemorrhoids.   Genitalia:  Pelvic Exam:        External: normal female genitalia without lesions or masses        Vagina: normal without lesions or masses        Cervix: normal without lesions or masses        Adnexa: normal bimanual exam without masses or fullness        Uterus: normal by palpation        Pap smear: performed Msk:  normal ROM, no joint tenderness, and no joint swelling.   Extremities:  no edema Neurologic:  alert & oriented X3 and cranial nerves II-XII intact.   Skin:  Intact without suspicious lesions or rashes Psych:  normally interactive and good eye contact.     Impression & Recommendations:  Problem # 1:  Preventive Health Care (ICD-V70.0) Reviewed preventive care protocols, scheduled due services, and updated immunizations Discussed nutrition, exercise, diet, and healthy lifestyle.  Pap smear today. FLP, BMET today. Set up mammogram and colonoscopy today.  Problem # 2:  HYPOTHYROIDISM (ICD-244.9) Assessment: Unchanged recheck TSH today. The following medications were removed from the medication list:    Armour Thyroid 60 Mg Tabs (Thyroid) .Marland Kitchen... 1 tab by mouth daily Her updated medication list for this problem includes:    Armour Thyroid 30 Mg Tabs (Thyroid) .Marland Kitchen... Take 1 by mouth once daily  Orders: Venipuncture ([address]) TLB-TSH (Thyroid Stimulating Hormone) (84443-TSH)  Complete Medication List: 1)  Vitamin D (ergocalciferol) 50000 Unit Caps (Ergocalciferol) .... Take one tablet by mouth once a week for six weeks 2)  Armour Thyroid 30 Mg Tabs (Thyroid) .... Take 1 by mouth once daily 3)  Fexofenadine Hcl 180 Mg Tabs (Fexofenadine hcl) .Marland Kitchen.. 1  daily as needed antihistamine 4)  Zantac 25 Mg/ml Soln (Ranitidine hcl) .... Take one tablet by mouth as needed for heatburn  Other Orders: Gastroenterology Referral (GI) TLB-Lipid Panel (80061-LIPID) TLB-Hepatic/Liver Function Pnl (80076-HEPATIC) TLB-CMP (Comprehensive Metabolic Pnl) (80053-COMP) Radiology Referral (Radiology)  Patient Instructions: 1)  Great to see you. 2)  Please stop by to see Shirlee Limerick on your way out.   Orders Added: 1)  Gastroenterology Referral [GI] 2)  TLB-Lipid Panel [80061-LIPID] 3)  TLB-Hepatic/Liver Function Pnl [80076-HEPATIC] 4)  TLB-CMP (Comprehensive Metabolic Pnl) [80053-COMP] 5)  Radiology Referral [Radiology] 6)  Venipuncture [60454] 7)  TLB-TSH (Thyroid Stimulating Hormone) [84443-TSH] 8)  Est. Patient 40-64 years [09811]    Current Allergies (reviewed today): ! PENICILLIN V POTASSIUM (PENICILLIN V POTASSIUM) ! STEROIDS Flu Vaccine Next Due:  Refused TD Next Due:  Refused     Prevention & Chronic Care Immunizations   Influenza vaccine: Not documented   Influenza vaccine deferral: Refused  (12/07/2009)   Influenza vaccine due: Refused  (12/07/2009)    Tetanus booster: Not documented   Td booster deferral: Refused  (12/07/2009)   Tetanus booster due: Refused  (12/07/2009)    Pneumococcal vaccine: Not documented  Colorectal Screening   Hemoccult: Not documented    Colonoscopy: Not documented  Colonoscopy action/deferral: GI referral  (12/07/2009)  Other Screening   Pap smear: normal  (01/29/2008)   Pap smear action/deferral: Ordered  (12/07/2009)   Pap smear due: 01/28/2010    Mammogram: normal  (02/03/2009)   Mammogram due: 02/03/2010   Smoking status: quit  (03/14/2009)  Lipids   Total Cholesterol: 231  (02/14/2009)   Lipid panel action/deferral: Lipid Panel ordered   LDL: Not documented   LDL Direct: 152.2  (02/14/2009)   HDL: 44.60  (02/14/2009)   Triglycerides: 190.0  (02/14/2009)    SGOT (AST): 21   (02/14/2009)   BMP action: Ordered   SGPT (ALT): 26  (02/14/2009) CMP ordered    Alkaline phosphatase: 52  (02/14/2009)   Total bilirubin: 0.6  (02/14/2009)  Self-Management Support :    Lipid self-management support: Not documented    Nursing Instructions: GI referral for screening colonoscopy (see order) Pap smear today

## 2010-03-23 NOTE — Procedures (Signed)
Summary: Colonoscopy  Patient: [name] Note: All result statuses are Final unless otherwise noted.  Tests: (1) Colonoscopy (COL)   COL Colonoscopy           DONE     [facility]     [address]     [place], [place]  [address]           COLONOSCOPY PROCEDURE REPORT           PATIENT:  Tiffany Schmidt, Tiffany Schmidt  MR#:  [id number]     BIRTHDATE:  Sep 15, 1960, 49 yrs. old  GENDER:  female     ENDOSCOPIST:  Hedwig Morton. Juanda Chance, MD     REF. BY:  Ruthe Mannan, M.D.     PROCEDURE DATE:  01/31/2010     PROCEDURE:  Colonoscopy 528[address]     ASA CLASS:  Class I     INDICATIONS:  Routine Risk Screening     MEDICATIONS:   Versed 10 mg, Fentanyl 100 mcg           DESCRIPTION OF PROCEDURE:   After the risks benefits and     alternatives of the procedure were thoroughly explained, informed     consent was obtained.  Digital rectal exam was performed and     revealed no rectal masses.   The  endoscope was introduced through     the anus and advanced to the cecum, which was identified by both     the appendix and ileocecal valve, without limitations.  The     quality of the prep was good, using MiraLax.  The instrument was     then slowly withdrawn as the colon was fully examined.     <<PROCEDUREIMAGES>>           FINDINGS:  Mild diverticulosis was found (see image1 and image4).     thickened haustral folds in the sigmoid colon, normal lumen,     shallow diverticuli  Otherwise normal colonoscopy without other     polyps, masses, vascular ectasias, or inflammatory changes (see     image2, image3, and image5).   Retroflexed views in the rectum     revealed no abnormalities.    The scope was then withdrawn from     the patient and the procedure completed.           COMPLICATIONS:  None     ENDOSCOPIC IMPRESSION:     1) Mild diverticulosis     RECOMMENDATIONS:     1) high fiber diet     REPEAT EXAM:  In 10 year(s) for.           ______________________________     Hedwig Morton. Juanda Chance, MD         CC:           n.     eSIGNED:   Hedwig Morton. Brodie at 01/31/2010 09:33 AM           [name], [id number]  Note: An exclamation mark (!) indicates a result that was not dispersed into the flowsheet. Document Creation Date: 01/31/2010 9:34 AM _______________________________________________________________________  (1) Order result status: Final Collection or observation date-time: 01/31/2010 09:26 Requested date-time:  Receipt date-time:  Reported date-time:  Referring Physician:   Ordering Physician: Lina Sar [phone]) Specimen Source:  Source: Launa Grill Order Number: [id number] Lab site:   Appended Document: Colonoscopy    Clinical Lists Changes  Observations: Added new observation of COLONNXTDUE: 01/2020 (01/31/2010 12:32)

## 2011-01-08 ENCOUNTER — Ambulatory Visit: Payer: Self-pay | Admitting: Family Medicine

## 2011-01-22 ENCOUNTER — Ambulatory Visit (INDEPENDENT_AMBULATORY_CARE_PROVIDER_SITE_OTHER): Payer: Managed Care, Other (non HMO) | Admitting: Family Medicine

## 2011-01-22 ENCOUNTER — Encounter: Payer: Self-pay | Admitting: Family Medicine

## 2011-01-22 VITALS — BP 120/76 | HR 68 | Temp 97.6°F | Ht 66.0 in | Wt 223.5 lb

## 2011-01-22 DIAGNOSIS — L309 Dermatitis, unspecified: Secondary | ICD-10-CM

## 2011-01-22 DIAGNOSIS — Z136 Encounter for screening for cardiovascular disorders: Secondary | ICD-10-CM

## 2011-01-22 DIAGNOSIS — E039 Hypothyroidism, unspecified: Secondary | ICD-10-CM

## 2011-01-22 DIAGNOSIS — Z0000 Encounter for general adult medical examination without abnormal findings: Secondary | ICD-10-CM

## 2011-01-22 DIAGNOSIS — L259 Unspecified contact dermatitis, unspecified cause: Secondary | ICD-10-CM

## 2011-01-22 DIAGNOSIS — E559 Vitamin D deficiency, unspecified: Secondary | ICD-10-CM

## 2011-01-22 LAB — BASIC METABOLIC PANEL
BUN: 14 mg/dL (ref 6–23)
CO2: 26 mEq/L (ref 19–32)
Calcium: 8.8 mg/dL (ref 8.4–10.5)
Glucose, Bld: 113 mg/dL — ABNORMAL HIGH (ref 70–99)
Sodium: 140 mEq/L (ref 135–145)

## 2011-01-22 LAB — LDL CHOLESTEROL, DIRECT: Direct LDL: 144.9 mg/dL

## 2011-01-22 LAB — LIPID PANEL
HDL: 49.6 mg/dL (ref >39.00)
VLDL: 21.4 mg/dL (ref 0.0–40.0)

## 2011-01-22 MED ORDER — NYSTATIN-TRIAMCINOLONE 100000-0.1 UNIT/GM-% EX OINT: TOPICAL_OINTMENT | Freq: Two times a day (BID) | CUTANEOUS | Status: AC

## 2011-01-22 NOTE — Patient Instructions (Signed)
Good to see you. Please send your physical forms to [place] and I will sign it.  Use the steroid cream/antifungal (mycolog) twice a day sparingly and call me next week with an update. Try limiting your gluten as well.

## 2011-01-22 NOTE — Progress Notes (Signed)
Subjective:    Patient ID: Tiffany Schmidt, female    DOB: 08-01-1960, 50 y.o.   MRN: [id number]  HPI  50 yo with h/o hypothyroidism here for CPX.  Rash on inner thighs bilaterally. Under a lot of stress this year- husband was diagnosed with esophageal cancer. Has known gluten intolerance but since he was diagnosed, she has been eating gluten because it is easier to find on the run. Since restarting gluten, she has developed this itchy, very irritated rash on bilateral inner thighs. No vaginal discharge or foul odor. Never had anything like this before. Monistat not helping.  Hypothyroidism- on Armour 60 mg daily. Denies any symptoms of hypo or hyperthyroidism.  Husband just finished chemo. Surgery went well- good margins.  Patient Active Problem List  Diagnoses  . Unspecified hypothyroidism  . VITAMIN D DEFICIENCY  . HYPERLIPIDEMIA  . ALLERGY, FOOD  . Routine general medical examination at a health care facility   Past Medical History  Diagnosis Date  . Thyroid disease   . Depression   . Hyperlipidemia   . Vitamin D deficiency   . Dermatitis due to food taken internally    Past Surgical History  Procedure Date  . Tonsillectomy and adenoidectomy    History  Substance Use Topics  . Smoking status: Former Smoker    Quit date: 01/21/2006  . Smokeless tobacco: Not on file  . Alcohol Use: Yes   Family History  Problem Relation Age of Onset  . Hyperlipidemia Mother   . Cancer Father     lung  . Heart disease Father 45    MI   Allergies  Allergen Reactions  . Penicillins     REACTION: hives   Current outpatient prescriptions:fexofenadine (ALLEGRA) 180 MG tablet, Take 180 mg by mouth daily.  , Disp: , Rfl: ;  ranitidine (ZANTAC EFFERDOSE) 25 MG effervescent tablet, Take 25 mg by mouth as needed.  , Disp: , Rfl: ;  thyroid (ARMOUR) 60 MG tablet, Take 60 mg by mouth daily.  , Disp: , Rfl: ;  nystatin-triamcinolone ointment (MYCOLOG), Apply topically 2 (two) times  daily., Disp: 30 g, Rfl: 0   The PMH, PSH, Social History, Family History, Medications, and allergies have been reviewed in [facility], and have been updated if relevant.   Review of Systems    See HPI Patient reports no  vision/ hearing changes,anorexia, weight change, fever ,adenopathy, persistant / recurrent hoarseness, swallowing issues, chest pain, edema,persistant / recurrent cough, hemoptysis, dyspnea(rest, exertional, paroxysmal nocturnal), gastrointestinal  bleeding (melena, rectal bleeding), abdominal pain, excessive heart burn, GU symptoms(dysuria, hematuria, pyuria, voiding/incontinence  Issues) syncope, focal weakness, severe memory loss, depression, anxiety, abnormal bruising/bleeding, major joint swelling, breast masses or abnormal vaginal bleeding.    Objective:   Physical Exam BP 120/76  Pulse 68  Temp(Src) 97.6 F (36.4 C) (Oral)  Ht 5\' 6"  (1.676 m)  Wt 223 lb 8 oz (101.379 kg)  BMI 36.07 kg/m2  LMP 07/21/2010  General:  Well-developed,well-nourished,in no acute distress; alert,appropriate and cooperative throughout examination Head:  normocephalic and atraumatic.   Eyes:  vision grossly intact, pupils equal, pupils round, and pupils reactive to light.   Ears:  R ear normal and L ear normal.   Nose:  no external deformity.   Mouth:  good dentition.   Neck:  No deformities, masses, or tenderness noted. Breasts:  No mass, nodules, thickening, tenderness, bulging, retraction, inflamation, nipple discharge or skin changes noted.   Lungs:  Normal respiratory effort, chest expands symmetrically. Lungs  are clear to auscultation, no crackles or wheezes. Heart:  Normal rate and regular rhythm. S1 and S2 normal without gallop, murmur, click, rub or other extra sounds. Abdomen:  Bowel sounds positive,abdomen soft and non-tender without masses, organomegaly or hernias noted. Rectal:  no external abnormalities.   Genitalia:  Pelvic Exam:         External: normal female genitalia  without lesions or masses, does have confluent raised, erythematous rash, bilateral thigh folds- with satellite lesions        Vagina: normal without lesions or masses       Msk:  No deformity or scoliosis noted of thoracic or lumbar spine.   Extremities:  No clubbing, cyanosis, edema, or deformity noted with normal full range of motion of all joints.   Neurologic:  alert & oriented X3 and gait normal.   Skin:  Intact without suspicious lesions or rashes Cervical Nodes:  No lymphadenopathy noted Axillary Nodes:  No palpable lymphadenopathy Psych:  Cognition and judgment appear intact. Alert and cooperative with normal attention span and concentration. No apparent delusions, illusions, hallucinations        Assessment & Plan:   1. Routine general medical examination at a health care facility  Reviewed preventive care protocols, scheduled due services, and updated immunizations Discussed nutrition, exercise, diet, and healthy lifestyle.  Basic Metabolic Panel (BMET)  2. Dermatitis Does have a fungal appearance but is very irritated- will try short course of Mycolog. The patient indicates understanding of these issues and agrees with the plan.    3. Unspecified hypothyroidism  TSH  4. Screening for ischemic heart disease  Lipid Profile

## 2011-01-23 ENCOUNTER — Encounter: Payer: Self-pay | Admitting: *Deleted

## 2011-01-25 ENCOUNTER — Other Ambulatory Visit: Payer: Self-pay | Admitting: Family Medicine

## 2012-02-18 ENCOUNTER — Other Ambulatory Visit: Payer: Self-pay | Admitting: Family Medicine

## 2012-03-31 ENCOUNTER — Other Ambulatory Visit: Payer: Self-pay | Admitting: Family Medicine

## 2012-04-28 ENCOUNTER — Encounter: Payer: Self-pay | Admitting: Family Medicine

## 2012-04-28 ENCOUNTER — Ambulatory Visit (INDEPENDENT_AMBULATORY_CARE_PROVIDER_SITE_OTHER): Payer: Managed Care, Other (non HMO) | Admitting: Family Medicine

## 2012-04-28 VITALS — BP 140/82 | HR 88 | Temp 98.0°F | Wt 221.0 lb

## 2012-04-28 DIAGNOSIS — E559 Vitamin D deficiency, unspecified: Secondary | ICD-10-CM

## 2012-04-28 DIAGNOSIS — E785 Hyperlipidemia, unspecified: Secondary | ICD-10-CM

## 2012-04-28 DIAGNOSIS — E039 Hypothyroidism, unspecified: Secondary | ICD-10-CM

## 2012-04-28 LAB — COMPREHENSIVE METABOLIC PANEL
ALT: 20 U/L (ref 0–35)
CO2: 25 mEq/L (ref 19–32)
Calcium: 8.7 mg/dL (ref 8.4–10.5)
Chloride: 107 mEq/L (ref 96–112)
Creatinine, Ser: 0.7 mg/dL (ref 0.4–1.2)
GFR: 87.43 mL/min (ref >60.00)
Glucose, Bld: 140 mg/dL — ABNORMAL HIGH (ref 70–99)
Total Bilirubin: 0.6 mg/dL (ref 0.3–1.2)
Total Protein: 6.6 g/dL (ref 6.0–8.3)

## 2012-04-28 LAB — TSH: TSH: 1.38 u[IU]/mL (ref 0.35–5.50)

## 2012-04-28 LAB — T4, FREE: Free T4: 0.7 ng/dL (ref 0.60–1.60)

## 2012-04-28 NOTE — Patient Instructions (Signed)
Good to see you. We will call you with your lab results and please remind that you need refills.

## 2012-04-28 NOTE — Progress Notes (Signed)
Subjective:    Patient ID: Tiffany Schmidt, female    DOB: 27-May-1960, 52 y.o.   MRN: [id number]  HPI  52 yo with h/o hypothyroidism, HLD and vit D deficiency here for "refill meds."    Hypothyroidism- on Armour 60 mg daily. Denies any symptoms of hypo or hyperthyroidism.  Not having "obsecssive thoughts" she often has when her thyroid is under corrected.   Lab Results  Component Value Date   TSH 1.12 01/22/2011     HLD- not taking any rx.  Does not want it rechecked today as she is going to schedule CPX soon. Lab Results  Component Value Date   CHOL 215* 01/22/2011   HDL 49.60 01/22/2011   LDLDIRECT 144.9 01/22/2011   TRIG 107.0 01/22/2011   CHOLHDL 4 01/22/2011   Vit D deficiency- alternates between 5,000 IU and 10, 000 IU.  She would like this rechecked today.  Patient Active Problem List  Diagnosis  . Unspecified hypothyroidism  . VITAMIN D DEFICIENCY  . HYPERLIPIDEMIA  . ALLERGY, FOOD   Past Medical History  Diagnosis Date  . Thyroid disease   . Depression   . Hyperlipidemia   . Vitamin d deficiency   . Dermatitis due to food taken internally    Past Surgical History  Procedure Laterality Date  . Tonsillectomy and adenoidectomy     History  Substance Use Topics  . Smoking status: Former Smoker    Quit date: 01/21/2006  . Smokeless tobacco: Not on file  . Alcohol Use: Yes   Family History  Problem Relation Age of Onset  . Hyperlipidemia Mother   . Cancer Father     lung  . Heart disease Father 33    MI   Allergies  Allergen Reactions  . Penicillins     REACTION: hives   Current outpatient prescriptions:ARMOUR THYROID 60 MG tablet, TAKE ONE TABLET BY MOUTH ONE TIME DAILY, Disp: 30 tablet, Rfl: 0;  fexofenadine (ALLEGRA) 180 MG tablet, Take 180 mg by mouth daily.  , Disp: , Rfl: ;  ranitidine (ZANTAC EFFERDOSE) 25 MG effervescent tablet, Take 25 mg by mouth as needed.  , Disp: , Rfl:    The PMH, PSH, Social History, Family History, Medications, and  allergies have been reviewed in [facility], and have been updated if relevant.   Review of Systems    See HPI Patient reports no  vision/ hearing changes,anorexia, weight change, fever ,adenopathy, persistant / recurrent hoarseness, swallowing issues, chest pain, edema,persistant / recurrent cough, hemoptysis, dyspnea(rest, exertional, paroxysmal nocturnal), gastrointestinal  bleeding (melena, rectal bleeding), abdominal pain, excessive heart burn, GU symptoms(dysuria, hematuria, pyuria, voiding/incontinence  Issues) syncope, focal weakness, severe memory loss, depression, anxiety, abnormal bruising/bleeding, major joint swelling, breast masses or abnormal vaginal bleeding.    Objective:   Physical Exam There were no vitals taken for this visit.  General:  Well-developed,well-nourished,in no acute distress; alert,appropriate and cooperative throughout examination Head:  normocephalic and atraumatic.   Eyes:  vision grossly intact, pupils equal, pupils round, and pupils reactive to light.   Ears:  R ear normal and L ear normal.   Nose:  no external deformity.   Mouth:  good dentition.   Neck:  No deformities, masses, or tenderness noted. Lungs:  Normal respiratory effort, chest expands symmetrically. Lungs are clear to auscultation, no crackles or wheezes. Heart:  Normal rate and regular rhythm. S1 and S2 normal without gallop, murmur, click, rub or other extra sounds. Abdomen:  Bowel sounds positive,abdomen soft and non-tender without masses,  organomegaly or hernias noted. Msk:  No deformity or scoliosis noted of thoracic or lumbar spine.   Extremities:  No clubbing, cyanosis, edema, or deformity noted with normal full range of motion of all joints.   Neurologic:  alert & oriented X3 and gait normal.   Skin:  Intact without suspicious lesions or rashes Cervical Nodes:  No lymphadenopathy noted Axillary Nodes:  No palpable lymphadenopathy Psych:  Cognition and judgment appear intact. Alert and  cooperative with normal attention span and concentration. No apparent delusions, illusions, hallucinations     Assessment & Plan:  1. HYPERLIPIDEMIA Deferred lipid panel today. - Comprehensive metabolic panel  2. Unspecified hypothyroidism Stable but thyroid function has not been rechecked in over a years.  Will recheck labs, refill armour once we have lab results.   - TSH - T4, Free  3. Unspecified vitamin D deficiency On Vit D- recheck labs today. - Vitamin D, 25-hydroxy

## 2012-04-30 ENCOUNTER — Other Ambulatory Visit: Payer: Self-pay | Admitting: *Deleted

## 2012-04-30 MED ORDER — THYROID 60 MG PO TABS: ORAL_TABLET | ORAL | Status: DC

## 2012-08-25 ENCOUNTER — Emergency Department (HOSPITAL_COMMUNITY): Payer: Managed Care, Other (non HMO)

## 2012-08-25 ENCOUNTER — Encounter (HOSPITAL_COMMUNITY): Payer: Self-pay

## 2012-08-25 ENCOUNTER — Emergency Department (HOSPITAL_COMMUNITY)
Admission: EM | Admit: 2012-08-25 | Discharge: 2012-08-26 | Disposition: A | Discharge status: Home / Self Care | Payer: Managed Care, Other (non HMO) | Attending: Emergency Medicine | Admitting: Emergency Medicine

## 2012-08-25 DIAGNOSIS — E568 Deficiency of other vitamins: Secondary | ICD-10-CM | POA: Insufficient documentation

## 2012-08-25 DIAGNOSIS — Z79899 Other long term (current) drug therapy: Secondary | ICD-10-CM | POA: Insufficient documentation

## 2012-08-25 DIAGNOSIS — Y92009 Unspecified place in unspecified non-institutional (private) residence as the place of occurrence of the external cause: Secondary | ICD-10-CM | POA: Insufficient documentation

## 2012-08-25 DIAGNOSIS — Z87891 Personal history of nicotine dependence: Secondary | ICD-10-CM | POA: Insufficient documentation

## 2012-08-25 DIAGNOSIS — S61209A Unspecified open wound of unspecified finger without damage to nail, initial encounter: Secondary | ICD-10-CM | POA: Insufficient documentation

## 2012-08-25 DIAGNOSIS — Z872 Personal history of diseases of the skin and subcutaneous tissue: Secondary | ICD-10-CM | POA: Insufficient documentation

## 2012-08-25 DIAGNOSIS — Y9389 Activity, other specified: Secondary | ICD-10-CM | POA: Insufficient documentation

## 2012-08-25 DIAGNOSIS — S61412A Laceration without foreign body of left hand, initial encounter: Secondary | ICD-10-CM

## 2012-08-25 DIAGNOSIS — Z8659 Personal history of other mental and behavioral disorders: Secondary | ICD-10-CM | POA: Insufficient documentation

## 2012-08-25 DIAGNOSIS — Z880 Allergy status to penicillin: Secondary | ICD-10-CM | POA: Insufficient documentation

## 2012-08-25 DIAGNOSIS — Z23 Encounter for immunization: Secondary | ICD-10-CM | POA: Insufficient documentation

## 2012-08-25 DIAGNOSIS — W278XXA Contact with other nonpowered hand tool, initial encounter: Secondary | ICD-10-CM | POA: Insufficient documentation

## 2012-08-25 DIAGNOSIS — E079 Disorder of thyroid, unspecified: Secondary | ICD-10-CM | POA: Insufficient documentation

## 2012-08-25 MED ORDER — TETANUS-DIPHTH-ACELL PERTUSSIS 5-2.5-18.5 LF-MCG/0.5 IM SUSP
0.5000 mL | Freq: Once | INTRAMUSCULAR | Status: AC
  Administered 2012-08-25: 0.5 mL via INTRAMUSCULAR
  Filled 2012-08-25: qty 0.5

## 2012-08-25 MED ORDER — FENTANYL CITRATE 0.05 MG/ML IJ SOLN
100.0000 ug | Freq: Once | INTRAMUSCULAR | Status: AC
  Administered 2012-08-25: 100 ug via NASAL
  Filled 2012-08-25: qty 2

## 2012-08-25 MED ORDER — CEPHALEXIN 500 MG PO CAPS
1000.0000 mg | ORAL_CAPSULE | Freq: Once | ORAL | Status: AC
Start: 2012-08-25 — End: 2012-08-25
  Administered 2012-08-25: 1000 mg via ORAL
  Filled 2012-08-25: qty 2

## 2012-08-25 MED ORDER — OXYCODONE-ACETAMINOPHEN 5-325 MG PO TABS
2.0000 | ORAL_TABLET | Freq: Once | ORAL | Status: AC
  Administered 2012-08-25: 2 via ORAL
  Filled 2012-08-25: qty 2

## 2012-08-25 NOTE — ED Notes (Signed)
MD at bedside. 

## 2012-08-25 NOTE — ED Provider Notes (Signed)
History    CSN: [id number] Arrival date & time 08/25/12  2047  First MD Initiated Contact with Patient 08/25/12 2140     Chief Complaint  Patient presents with  . 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 She does not want pain medicine at this time. She does want a hand surgeon consulted. Past Medical History  Diagnosis Date  . Thyroid disease   . Depression   . Hyperlipidemia   . Vitamin D deficiency   . Dermatitis due to food taken internally    Past Surgical History  Procedure Laterality Date  . Tonsillectomy and adenoidectomy     Family History  Problem Relation Age of Onset  . Hyperlipidemia Mother   . Cancer Father     lung  . Heart disease Father 61    MI   History  Substance Use Topics  . Smoking status: Former Smoker    Quit date: 01/21/2006  . Smokeless tobacco: Not on file  . Alcohol Use: Yes   OB History   Grav Para Term Preterm Abortions TAB SAB Ect Mult Living                 Review of Systems 10 Systems reviewed and are negative for acute change except as noted in the HPI. Allergies  Penicillins  Home Medications   Current Outpatient Rx  Name  Route  Sig  Dispense  Refill  . Cholecalciferol (VITAMIN D3 SUPER STRENGTH) 2000 UNITS TABS   Oral   Take 6,000 Units by mouth.          . thyroid (ARMOUR THYROID) 60 MG tablet      TAKE ONE TABLET BY MOUTH ONE TIME DAILY   30 tablet   5   . cephALEXin (KEFLEX) 500 MG capsule      2 caps po bid x 7 days   28 capsule   0   . oxyCODONE-acetaminophen (PERCOCET) 5-325 MG per tablet   Oral   Take 2 tablets by mouth every 4 (four) hours as needed for pain.   20 tablet   0   . ranitidine (ZANTAC EFFERDOSE) 25 MG effervescent tablet   Oral   Take 25 mg by mouth as needed.            BP 129/69  Pulse 80  Temp(Src) 97.9 F (36.6 C)  Resp 16  SpO2 97%  LMP 07/21/2012 Physical Exam  Nursing note and vitals reviewed. Constitutional:  Awake, alert, nontoxic appearance.  HENT:  Head: Atraumatic.  Eyes: Right eye exhibits no discharge. Left eye exhibits no discharge.  Neck: Neck supple.  Pulmonary/Chest: Effort normal. She exhibits no tenderness.  Abdominal: Soft. There is no tenderness. There is no rebound.  Musculoskeletal: She exhibits no tenderness.  Baseline ROM, no obvious new focal weakness. Left hand has capillary refill less than 2 seconds all digits with decreased  light touch with loss of 2. examination at the fingertip distal to the DIP joint, all remaining digits have 2. discrimination intact, there is no apparent bony or joint tenderness, her thumb is uninjured with good flexion and extension against resistance, her small finger is essentially uninjured with intact extension FDP and FDS testing against resistance, the injured digits including index long and ring fingers it should also have intact strength 5 out of 5 against resistance for extension FDP and FDS testing, she has irregular lacerations 2 cm to the volar aspect of the middle pad left index finger 2 cm irregular distal volar pad long finger and 3 cm to the ring finger distal volar pad  Neurological:  Mental status and motor strength appears baseline for patient and situation.  Skin: No rash noted.  Psychiatric: She has a normal mood and affect.     ED Course  Procedures (including critical care time) D/w [place] per Pt request for hand surgeon to repair, Pt can f/u with hand surg in 2 days to consider delayed repair possible OR prn Pt aware and agrees with plan to irrigate and dressings in ED.  Physician assistant performed digital blocks wounds explored by myself and physician assistant no significant deep structure involvement or FBs noted in the ED with no bone or tendon or neurovascular involvement noted however ring finger has numbness distal to the injury concerning for nerve involvement without arterial bleeding noted in the ED Patient / Family / Caregiver informed of clinical course, understand medical decision-making process, and agree with plan. Labs Reviewed - No data to display Dg Hand Complete Left  08/25/2012   *RADIOLOGY REPORT*  Clinical Data: Laceration to the left fingers with head tremor tonight.  Pain in the left hand.  LEFT HAND - COMPLETE 3+ VIEW  Comparison: None.  Findings: Soft tissue lacerations demonstrated at the distal second, third, and fourth fingers.  The bones appear intact. No evidence of acute fracture or subluxation.  No focal bone lesions. Bone matrix and cortex appear intact.  No abnormal radiopaque densities in the soft tissues.  IMPRESSION: Soft tissue lacerations of the distal left second, third, and fourth fingers.  No acute bony abnormality.  No radiopaque foreign bodies.   Original Report Authenticated By: Burman Nieves, M.D.   1. Laceration of left hand, initial encounter     MDM  I doubt any other [facility] precluding discharge at this time including, but not necessarily limited to the following:need for emergent OR this visit.  Hurman Horn, MD 08/26/12 2102

## 2012-08-25 NOTE — ED Notes (Signed)
Pt cut three fingers and part of her left hand with hedge clippers about 20 minutes ago, bleeding isn't controlled at this time, pt request for a had specialist because she works with her hands for a living

## 2012-08-26 MED ORDER — OXYCODONE-ACETAMINOPHEN 5-325 MG PO TABS: 2.0000 | ORAL_TABLET | ORAL | Status: DC | PRN

## 2012-08-26 MED ORDER — CEPHALEXIN 500 MG PO CAPS: ORAL_CAPSULE | ORAL | Status: DC

## 2012-08-26 NOTE — ED Notes (Signed)
PA dressed wound and cleaned under lidocaine numbing.

## 2012-11-17 ENCOUNTER — Other Ambulatory Visit: Payer: Self-pay | Admitting: *Deleted

## 2012-11-17 MED ORDER — THYROID 60 MG PO TABS: ORAL_TABLET | ORAL | Status: DC

## 2013-06-03 ENCOUNTER — Other Ambulatory Visit: Payer: Self-pay | Admitting: *Deleted

## 2013-06-03 MED ORDER — THYROID 60 MG PO TABS: ORAL_TABLET | ORAL | Status: DC

## 2014-04-28 ENCOUNTER — Telehealth: Payer: Self-pay | Admitting: Family Medicine

## 2014-04-28 NOTE — Telephone Encounter (Signed)
Yes ok to schedule TSH, FT4, T3

## 2014-04-28 NOTE — Telephone Encounter (Signed)
Appointment 3/11 pt aware

## 2014-04-28 NOTE — Telephone Encounter (Signed)
Pt has appointment on Monday she would like to get labs done tomorrow to check thyroid  Is it ok to schedule

## 2014-04-30 ENCOUNTER — Other Ambulatory Visit (INDEPENDENT_AMBULATORY_CARE_PROVIDER_SITE_OTHER): Payer: BLUE CROSS/BLUE SHIELD

## 2014-04-30 DIAGNOSIS — E038 Other specified hypothyroidism: Secondary | ICD-10-CM

## 2014-04-30 LAB — TSH: TSH: 3.24 u[IU]/mL (ref 0.35–4.50)

## 2014-04-30 LAB — T4, FREE: Free T4: 0.66 ng/dL (ref 0.60–1.60)

## 2014-04-30 LAB — T3: T3, Total: 117.8 ng/dL (ref 80.0–204.0)

## 2014-05-03 ENCOUNTER — Encounter: Payer: Self-pay | Admitting: Family Medicine

## 2014-05-03 ENCOUNTER — Ambulatory Visit (INDEPENDENT_AMBULATORY_CARE_PROVIDER_SITE_OTHER): Payer: BLUE CROSS/BLUE SHIELD | Admitting: Family Medicine

## 2014-05-03 VITALS — BP 112/66 | HR 60 | Temp 97.7°F | Wt 207.0 lb

## 2014-05-03 DIAGNOSIS — E038 Other specified hypothyroidism: Secondary | ICD-10-CM

## 2014-05-03 DIAGNOSIS — R498 Other voice and resonance disorders: Secondary | ICD-10-CM | POA: Insufficient documentation

## 2014-05-03 MED ORDER — LEVOTHYROXINE SODIUM 25 MCG PO TABS: 25.0000 ug | ORAL_TABLET | Freq: Every day | ORAL | Status: DC

## 2014-05-03 NOTE — Assessment & Plan Note (Signed)
>  25 minutes spent in face to face time with patient, >50% spent in counselling or coordination of care Thyroid panel within normal limits but she feels clinically hypothyroid. She would like to start another thyroid replacement. eRx sent for synthroid 25 mcg daily. Follow up labs in 4 weeks. The patient indicates understanding of these issues and agrees with the plan.

## 2014-05-03 NOTE — Assessment & Plan Note (Signed)
Intermittent- poor historian. She feels it comes and goes but not sure for how long. No difficulty swallowing.  I strongly urged her to allow me to refer her to ENT for evaluation of her vocal chords but she declined any further work up.

## 2014-05-03 NOTE — Progress Notes (Signed)
Subjective:   Patient ID: Tiffany Schmidt, female    DOB: May 18, 1960, 54 y.o.   MRN: [id number]  Tiffany Schmidt is a pleasant 54 y.o. year old female who presents to clinic today with Follow-up  on 05/03/2014  HPI:  Hypothyroidism- was taking Armour 60 mg daily.  Stopped taking it about a year ago- had burping which subsided when she stopped taking it.  Also did not any other symptoms when she stopped taking it.  Now having some thinning of her eye brows, dry skin, cold hands and feet. No changes in bowel habits.  Voice has been more shaky.   Not having "obsecssive thoughts" she often has when her thyroid is under corrected.   Has lost weight but has been trying to lose weight but feels it she didn't put in "that much effort."  Improved her diet, started taking probiotics.  Wt Readings from Last 3 Encounters:  05/03/14 207 lb (93.895 kg)  04/28/12 221 lb (100.245 kg)  01/22/11 223 lb 8 oz (101.379 kg)     Lab Results  Component Value Date   TSH 3.24 04/30/2014   Current Outpatient Prescriptions on File Prior to Visit  Medication Sig Dispense Refill  . thyroid (ARMOUR THYROID) 60 MG tablet TAKE ONE TABLET BY MOUTH ONE TIME DAILY (Patient not taking: Reported on 05/03/2014) 30 tablet 6   No current facility-administered medications on file prior to visit.    Allergies  Allergen Reactions  . Penicillins     REACTION: hives    Past Medical History  Diagnosis Date  . Thyroid disease   . Depression   . Hyperlipidemia   . Vitamin D deficiency   . Dermatitis due to food taken internally     Past Surgical History  Procedure Laterality Date  . Tonsillectomy and adenoidectomy      Family History  Problem Relation Age of Onset  . Hyperlipidemia Mother   . Cancer Father     lung  . Heart disease Father [id number]    MI    History   Social History  . Marital Status: Married    Spouse Name: N/A  . Number of Children: N/A  . Years of Education: N/A   Occupational  History  . Not on file.   Social History Main Topics  . Smoking status: Former Smoker    Quit date: 01/21/2006  . Smokeless tobacco: Not on file  . Alcohol Use: Yes  . Drug Use: Not on file  . Sexual Activity: Not on file   Other Topics Concern  . 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   Patient states former smoker.  Quit in 2007.  1ppd x 30 yrs.    The PMH, PSH, Social History, Family History, Medications, and allergies [facility], and have been updated if relevant.   Review of Systems  Constitutional: Positive for fatigue. Negative for fever and appetite change.  HENT: Positive for voice change. Negative for trouble swallowing.   Eyes: Negative.   Respiratory: Negative.   Cardiovascular: Negative.   Gastrointestinal: Negative.   Hematological: Negative.   Psychiatric/Behavioral: Negative.   All other systems reviewed and are negative.      Objective:    BP 112/66 mmHg  Pulse 60  Temp(Src) 97.7 F (36.5 C) (Oral)  Wt 207 lb (93.895 kg)  SpO2 97%   Physical Exam  Constitutional: She is oriented to person, place, and time. She appears well-developed and well-nourished. No distress.  Voice a little shaky today  HENT:  Head: Normocephalic.  Eyes: Conjunctivae are normal.  Neck: Normal range of motion. Neck supple. No thyromegaly present.  Cardiovascular: Normal rate and regular rhythm.   Pulmonary/Chest: Effort normal and breath sounds normal. No respiratory distress.  Abdominal: Soft.  Neurological: She is alert and oriented to person, place, and time. No cranial nerve deficit.  Skin: Skin is warm and dry.  Psychiatric: She has a normal mood and affect. Her behavior is normal. Judgment and thought content normal.  Nursing note and vitals reviewed.         Assessment & Plan:   Other specified hypothyroidism No Follow-up on file.

## 2014-05-03 NOTE — Patient Instructions (Signed)
Good to see you. Please make an appointment for lab visit in 4 weeks.  We are starting synthroid 25 mcg daily before breakfast.

## 2014-05-03 NOTE — Progress Notes (Signed)
Pre visit review using our clinic review tool, if applicable. No additional management support is needed unless otherwise documented below in the visit note. 

## 2014-05-27 ENCOUNTER — Other Ambulatory Visit: Payer: Self-pay | Admitting: Family Medicine

## 2014-05-27 DIAGNOSIS — E038 Other specified hypothyroidism: Secondary | ICD-10-CM

## 2014-05-31 ENCOUNTER — Other Ambulatory Visit (INDEPENDENT_AMBULATORY_CARE_PROVIDER_SITE_OTHER): Payer: BLUE CROSS/BLUE SHIELD

## 2014-05-31 DIAGNOSIS — E038 Other specified hypothyroidism: Secondary | ICD-10-CM

## 2014-05-31 LAB — T4, FREE: FREE T4: 0.8 ng/dL (ref 0.60–1.60)

## 2014-05-31 LAB — TSH: TSH: 2.47 u[IU]/mL (ref 0.35–4.50)

## 2014-05-31 LAB — T3, FREE: T3, Free: 2.9 pg/mL (ref 2.3–4.2)

## 2014-06-02 ENCOUNTER — Encounter: Payer: Self-pay | Admitting: *Deleted

## 2014-07-13 ENCOUNTER — Other Ambulatory Visit: Payer: Self-pay | Admitting: Family Medicine

## 2014-07-26 ENCOUNTER — Telehealth: Payer: Self-pay | Admitting: *Deleted

## 2014-07-26 ENCOUNTER — Encounter: Payer: Self-pay | Admitting: Family Medicine

## 2014-07-26 NOTE — Telephone Encounter (Signed)
Mammogram f/u call. Pt doesn't have a mammogram in chart in over 2 years. Called pt and she said she doesn't get mammograms anymore and declined to schedule appt., chart updated

## 2019-04-01 DIAGNOSIS — L570 Actinic keratosis: Secondary | ICD-10-CM | POA: Diagnosis not present

## 2019-04-01 DIAGNOSIS — L821 Other seborrheic keratosis: Secondary | ICD-10-CM | POA: Diagnosis not present

## 2019-04-01 DIAGNOSIS — L853 Xerosis cutis: Secondary | ICD-10-CM | POA: Diagnosis not present

## 2019-04-01 DIAGNOSIS — L578 Other skin changes due to chronic exposure to nonionizing radiation: Secondary | ICD-10-CM | POA: Diagnosis not present

## 2019-04-01 DIAGNOSIS — D225 Melanocytic nevi of trunk: Secondary | ICD-10-CM | POA: Diagnosis not present

## 2019-04-16 DIAGNOSIS — H2513 Age-related nuclear cataract, bilateral: Secondary | ICD-10-CM | POA: Diagnosis not present

## 2019-04-16 DIAGNOSIS — H52203 Unspecified astigmatism, bilateral: Secondary | ICD-10-CM | POA: Diagnosis not present

## 2019-04-16 DIAGNOSIS — H5203 Hypermetropia, bilateral: Secondary | ICD-10-CM | POA: Diagnosis not present

## 2019-07-24 DIAGNOSIS — E039 Hypothyroidism, unspecified: Secondary | ICD-10-CM | POA: Diagnosis not present

## 2019-07-24 DIAGNOSIS — N951 Menopausal and female climacteric states: Secondary | ICD-10-CM | POA: Diagnosis not present

## 2019-07-24 DIAGNOSIS — G4700 Insomnia, unspecified: Secondary | ICD-10-CM | POA: Diagnosis not present

## 2019-07-24 DIAGNOSIS — E782 Mixed hyperlipidemia: Secondary | ICD-10-CM | POA: Diagnosis not present

## 2019-07-24 DIAGNOSIS — R5383 Other fatigue: Secondary | ICD-10-CM | POA: Diagnosis not present

## 2019-07-24 DIAGNOSIS — R7309 Other abnormal glucose: Secondary | ICD-10-CM | POA: Diagnosis not present

## 2019-08-05 DIAGNOSIS — G4700 Insomnia, unspecified: Secondary | ICD-10-CM | POA: Diagnosis not present

## 2019-08-05 DIAGNOSIS — E669 Obesity, unspecified: Secondary | ICD-10-CM | POA: Diagnosis not present

## 2019-08-05 DIAGNOSIS — R5383 Other fatigue: Secondary | ICD-10-CM | POA: Diagnosis not present

## 2019-08-05 DIAGNOSIS — E039 Hypothyroidism, unspecified: Secondary | ICD-10-CM | POA: Diagnosis not present

## 2019-08-05 DIAGNOSIS — M25572 Pain in left ankle and joints of left foot: Secondary | ICD-10-CM | POA: Diagnosis not present

## 2019-08-05 DIAGNOSIS — R7309 Other abnormal glucose: Secondary | ICD-10-CM | POA: Diagnosis not present

## 2019-12-08 DIAGNOSIS — M7731 Calcaneal spur, right foot: Secondary | ICD-10-CM | POA: Diagnosis not present

## 2019-12-08 DIAGNOSIS — M79671 Pain in right foot: Secondary | ICD-10-CM | POA: Diagnosis not present

## 2019-12-11 DIAGNOSIS — Z20822 Contact with and (suspected) exposure to covid-19: Secondary | ICD-10-CM | POA: Diagnosis not present

## 2020-04-26 ENCOUNTER — Encounter: Payer: Self-pay | Admitting: Gastroenterology

## 2020-11-11 DIAGNOSIS — L570 Actinic keratosis: Secondary | ICD-10-CM | POA: Diagnosis not present

## 2020-11-11 DIAGNOSIS — L578 Other skin changes due to chronic exposure to nonionizing radiation: Secondary | ICD-10-CM | POA: Diagnosis not present

## 2020-11-11 DIAGNOSIS — L821 Other seborrheic keratosis: Secondary | ICD-10-CM | POA: Diagnosis not present

## 2020-11-11 DIAGNOSIS — D225 Melanocytic nevi of trunk: Secondary | ICD-10-CM | POA: Diagnosis not present

## 2020-11-11 DIAGNOSIS — L308 Other specified dermatitis: Secondary | ICD-10-CM | POA: Diagnosis not present

## 2021-01-04 ENCOUNTER — Ambulatory Visit (HOSPITAL_BASED_OUTPATIENT_CLINIC_OR_DEPARTMENT_OTHER): Payer: BLUE CROSS/BLUE SHIELD | Admitting: Nurse Practitioner

## 2021-01-05 ENCOUNTER — Ambulatory Visit (INDEPENDENT_AMBULATORY_CARE_PROVIDER_SITE_OTHER): Payer: BC Managed Care – PPO | Admitting: Nurse Practitioner

## 2021-01-05 ENCOUNTER — Other Ambulatory Visit: Payer: Self-pay

## 2021-01-05 ENCOUNTER — Encounter (HOSPITAL_BASED_OUTPATIENT_CLINIC_OR_DEPARTMENT_OTHER): Payer: Self-pay | Admitting: Nurse Practitioner

## 2021-01-05 VITALS — BP 130/82 | HR 97 | Ht 66.0 in | Wt 233.0 lb

## 2021-01-05 DIAGNOSIS — F422 Mixed obsessional thoughts and acts: Secondary | ICD-10-CM | POA: Diagnosis not present

## 2021-01-05 DIAGNOSIS — Z0000 Encounter for general adult medical examination without abnormal findings: Secondary | ICD-10-CM

## 2021-01-05 DIAGNOSIS — Z13228 Encounter for screening for other metabolic disorders: Secondary | ICD-10-CM

## 2021-01-05 DIAGNOSIS — S0031XA Abrasion of nose, initial encounter: Secondary | ICD-10-CM

## 2021-01-05 DIAGNOSIS — F419 Anxiety disorder, unspecified: Secondary | ICD-10-CM | POA: Diagnosis not present

## 2021-01-05 DIAGNOSIS — E039 Hypothyroidism, unspecified: Secondary | ICD-10-CM

## 2021-01-05 DIAGNOSIS — Z1329 Encounter for screening for other suspected endocrine disorder: Secondary | ICD-10-CM

## 2021-01-05 DIAGNOSIS — Z130 Encounter for screening for diseases of the blood and blood-forming organs and certain disorders involving the immune mechanism: Secondary | ICD-10-CM

## 2021-01-05 DIAGNOSIS — Z1321 Encounter for screening for nutritional disorder: Secondary | ICD-10-CM

## 2021-01-05 DIAGNOSIS — F32A Depression, unspecified: Secondary | ICD-10-CM

## 2021-01-05 MED ORDER — MUPIROCIN 2 % EX OINT: TOPICAL_OINTMENT | Freq: Two times a day (BID) | CUTANEOUS | 3 refills | Status: DC

## 2021-01-05 MED ORDER — FLUOXETINE HCL 20 MG PO CAPS
20.0000 mg | ORAL_CAPSULE | Freq: Every day | ORAL | 3 refills | Status: DC
Start: 2021-01-12 — End: 2022-01-15

## 2021-01-05 MED ORDER — FLUOXETINE HCL 10 MG PO CAPS: 10.0000 mg | ORAL_CAPSULE | Freq: Every day | ORAL | 0 refills | Status: DC

## 2021-01-05 NOTE — Assessment & Plan Note (Signed)
Symptoms consistent with obsessional thoughts and obsessive consult compulsive disorder. She is currently in therapy at this time. Will avoid levothyroxine as this has worsened the symptoms in the past. We will plan to begin Prozac today and follow-up in approximately 3 weeks to see how she is doing with the medication. If symptoms progress or worsen recommend immediate evaluation in the emergency room or behavioral health urgent care.

## 2021-01-05 NOTE — Assessment & Plan Note (Addendum)
New patient to establish care today.  CPE today. She has not had a PCP in quite some time. There are some records available in the electronic medical record that have been reviewed today. It appears that she is due for multiple screenings including mammogram, Pap smear, shingles vaccine, colonoscopy, and laboratory work. We will start today with laboratory work and addressing her thyroid and mental health issues and plan to address the other issues in the near future.

## 2021-01-05 NOTE — Progress Notes (Signed)
Tollie Eth, DNP, AGNP-c [facility] [address]  [address] [place], [place] [address] [phone]  New patient visit   Patient: Tiffany Schmidt   DOB: Apr 19, 1960   60 y.o. Female  MRN: [id number] Visit Date: 01/05/2021  Patient Care Team: Joshawa Dubin, Sung Amabile, NP as PCP - General (Nurse Practitioner)  Today's healthcare provider: Tollie Eth, NP   Chief Complaint  Patient presents with   New Patient (Initial Visit)    Patient is here to establish care. She has a rash in left nostril. She is having lots of trouble with anxiety.   Subjective    Emilye Furey is a 60 y.o. female who presents today as a new patient to establish care.  HPI  Merrissa endorses concerns today with a "rash" in her left nostril, increased anxiety, and need for medical evaluation for her employer.  She tells me she intermittently gets sores or irritation in her nostril.  She is concerned that she may have an polyp present in however she has not been evaluated for this. She reports irritation started a few days ago. She endorses pain with pressure on the nose and a visual scab present.  She denies purulent drainage fever or chills in this time.  She has not tried anything for this.  Nothing seems to make it worse.  She has a history of hypothyroidism with treatment with Armour Thyroid and NP thyroid in the past with [facility]. She was later sent to endocrinology where this was changed to levothyroxine however she reports that the levothyroxine initiated repetitive thoughts about "how I would die".  She tells me that she would wake up at multiple times during the night and not be able to stop thinking about this.  She is also been experiencing thinning hair, anxiety, and endorses that she is extremely fatigued lately for no known reason. She has not been on medication for hypothyroidism in the last 3 years.   She endorses concerns with "really horrible"  mood lately.  She states that she is suffering from anxiety, depression, and repetitive thinking/OCD behaviors.  She started seeing a therapist about 5 weeks ago and they recommended that she consider starting Prozac for her mood.  She is interested in trying this today.  Past Medical History:  Diagnosis Date   Depression    Dermatitis due to food taken internally    Hyperlipidemia    Thyroid disease    Vitamin D deficiency    Past Surgical History:  Procedure Laterality Date   TONSILLECTOMY AND ADENOIDECTOMY     Family Status  Relation Name Status   Mother  Alive   Father  Deceased   Family History  Problem Relation Age of Onset   Hyperlipidemia Mother    Cancer Father        lung   Heart disease Father 68       MI   Social History   Socioeconomic History   Marital status: Married    Spouse name: Not on file   Number of children: Not on file   Years of education: Not on file   Highest education level: Not on file  Occupational History   Not on file  Tobacco Use   Smoking status: Former    Types: Cigarettes    Quit date: 01/21/2006    Years since quitting: 14.9   Smokeless tobacco: Not on file  Substance and Sexual Activity   Alcohol use: Yes  Drug use: Not on file   Sexual activity: Not on file  Other Topics Concern   Not on file  Social History Narrative   Lives with husband in [place], no children   She is a Veterinary surgeon, Animal nutritionist.   Patient states former smoker.  Quit in 2007.  1ppd x 30 yrs.    Social Determinants of Health   Financial Resource Strain: Not on file  Food Insecurity: Not on file  Transportation Needs: Not on file  Physical Activity: Not on file  Stress: Not on file  Social Connections: Not on file   Outpatient Medications Prior to Visit  Medication Sig   levothyroxine (SYNTHROID, LEVOTHROID) 25 MCG tablet TAKE ONE TABLET BY MOUTH ONE TIME DAILY BEFORE BREAKFAST   No facility-administered medications prior to visit.    Allergies  Allergen Reactions   Penicillins     REACTION: hives    Immunization History  Administered Date(s) Administered   Tdap 08/25/2012    Health Maintenance  Topic Date Due   COVID-19 Vaccine (1) Never done   HIV Screening  Never done   Hepatitis C Screening  Never done   Zoster Vaccines- Shingrix (1 of 2) Never done   MAMMOGRAM  02/07/2012   PAP SMEAR-Modifier  12/07/2012   COLONOSCOPY (Pts 45-13yrs Insurance coverage will need to be confirmed)  02/01/2020   INFLUENZA VACCINE  Never done   TETANUS/TDAP  08/26/2022   Pneumococcal Vaccine 43-43 Years old  Aged Out   HPV VACCINES  Aged Out    Patient Care Team: Verna Hamon, Sung Amabile, NP as PCP - General (Nurse Practitioner)  Review of Systems All review of systems negative except what is listed in the HPI    Objective    BP 130/82   Pulse 97   Ht 5\' 6"  (1.676 m)   Wt 233 lb (105.7 kg)   SpO2 (!) 74%   BMI 37.61 kg/m  Physical Exam Vitals and nursing note reviewed.  Constitutional:      General: She is not in acute distress.    Appearance: Normal appearance.  HENT:     Head: Normocephalic and atraumatic.     Right Ear: Hearing, tympanic membrane, ear canal and external ear normal.     Left Ear: Hearing, tympanic membrane, ear canal and external ear normal.     Nose: Nose normal.     Right Sinus: No maxillary sinus tenderness or frontal sinus tenderness.     Left Sinus: No maxillary sinus tenderness or frontal sinus tenderness.     Mouth/Throat:     Lips: Pink.     Mouth: Mucous membranes are moist.     Pharynx: Oropharynx is clear.  Eyes:     General: Lids are normal. Vision grossly intact.     Extraocular Movements: Extraocular movements intact.     Conjunctiva/sclera: Conjunctivae normal.     Pupils: Pupils are equal, round, and reactive to light.     Funduscopic exam:    Right eye: Red reflex present.        Left eye: Red reflex present.    Visual Fields: Right eye visual fields normal and left eye  visual fields normal.  Neck:     Thyroid: No thyromegaly.     Vascular: No carotid bruit.  Cardiovascular:     Rate and Rhythm: Normal rate and regular rhythm.     Chest Wall: PMI is not displaced.     Pulses: Normal pulses.  Dorsalis pedis pulses are 2+ on the right side and 2+ on the left side.       Posterior tibial pulses are 2+ on the right side and 2+ on the left side.     Heart sounds: Normal heart sounds. No murmur heard. Pulmonary:     Effort: Pulmonary effort is normal. No respiratory distress.     Breath sounds: Normal breath sounds.  Abdominal:     General: Abdomen is flat. Bowel sounds are normal. There is no distension.     Palpations: Abdomen is soft. There is no hepatomegaly, splenomegaly or mass.     Tenderness: There is no abdominal tenderness. There is no right CVA tenderness, left CVA tenderness, guarding or rebound.  Musculoskeletal:        General: Normal range of motion.     Cervical back: Full passive range of motion without pain, normal range of motion and neck supple. No tenderness.     Right lower leg: No edema.     Left lower leg: No edema.  Feet:     Left foot:     Toenail Condition: Left toenails are normal.  Lymphadenopathy:     Cervical: No cervical adenopathy.     Upper Body:     Right upper body: No supraclavicular adenopathy.     Left upper body: No supraclavicular adenopathy.  Skin:    General: Skin is warm and dry.     Capillary Refill: Capillary refill takes less than 2 seconds.     Nails: There is no clubbing.  Neurological:     General: No focal deficit present.     Mental Status: She is alert and oriented to person, place, and time.     GCS: GCS eye subscore is 4. GCS verbal subscore is 5. GCS motor subscore is 6.     Sensory: Sensation is intact.     Motor: Motor function is intact.     Coordination: Coordination is intact.     Gait: Gait is intact.     Deep Tendon Reflexes: Reflexes are normal and symmetric.  Psychiatric:         Attention and Perception: Attention normal.        Mood and Affect: Mood normal.        Speech: Speech normal.        Behavior: Behavior normal. Behavior is cooperative.        Thought Content: Thought content normal.        Cognition and Memory: Cognition and memory normal.        Judgment: Judgment normal.     Depression Screen PHQ 2/9 Scores 01/05/2021  PHQ - 2 Score 4  PHQ- 9 Score 17   No results found for any visits on 01/05/21.  Assessment & Plan      Problem List Items Addressed This Visit     Hypothyroidism    Chart review reveals previous diagnosis of hypothyroidism with Synthroid use. Most recent evaluation 05/03/2014.  It does not appear that patient followed up after that point. Discussed with patient the multitude of symptoms that can present from uncontrolled hypothyroidism and the significance of the condition and importance of medication management for appropriate functioning throughout the body. Will obtain labs today for evaluation and plan to start medication in the near future. At this time will avoid levothyroxine/Synthroid as she has had significant negative thoughts while on this medication.  She did tolerate Armour and NP thyroid well and will consider 1 of  these versions for treatment when starting.      Relevant Orders   TSH   Encounter for medical examination to establish care - Primary    New patient to establish care today. She has not had a PCP in quite some time. There are some records available in the electronic medical record that have been reviewed today. It appears that she is due for multiple screenings including mammogram, Pap smear, shingles vaccine, colonoscopy, and laboratory work. We will start today with laboratory work and addressing her thyroid and mental health issues and plan to address the other issues in the near future.      Relevant Orders   CBC with Differential/Platelet   Comprehensive metabolic panel   Lipid panel    Hemoglobin A1c   VITAMIN D 25 Hydroxy (Vit-D Deficiency, Fractures)   TSH   Anxiety and depression    Significant anxiety and depression symptoms are present today. Positive GAD and PHQ obtained. Patient does have positive answer to question 9 on PHQ however endorses no current plan or intentions upon acting upon these thoughts. She is currently in therapy. Discussed that uncontrolled thyroid could be significantly impacting her mental health and we need to get this under control.  We will obtain labs today.  We will also begin Prozac today and plan to follow-up in 3 to 4 weeks with virtual visit to see how she was doing. If symptoms worsen or thoughts of self-harm return or a plan/intent on self-harm or present please seek emergency care in the behavioral health urgent care for immediate evaluation.      Relevant Medications   FLUoxetine (PROZAC) 10 MG capsule   FLUoxetine (PROZAC) 20 MG capsule (Start on 01/12/2021)   Mixed obsessional thoughts and acts    Symptoms consistent with obsessional thoughts and obsessive consult compulsive disorder. She is currently in therapy at this time. Will avoid levothyroxine as this has worsened the symptoms in the past. We will plan to begin Prozac today and follow-up in approximately 3 weeks to see how she is doing with the medication. If symptoms progress or worsen recommend immediate evaluation in the emergency room or behavioral health urgent care.       Relevant Medications   FLUoxetine (PROZAC) 10 MG capsule   FLUoxetine (PROZAC) 20 MG capsule (Start on 01/12/2021)   Abrasion of nose    Abrasion to the mucous membrane of the left nare. No purulent drainage noted at this time.  Yellow crust scabbing is present over the abrasion. Discussed with patient that we can treat with mupirocin which will be effective against staphylococcal infection if this were to be present.  Recommend applying twice a day for at least 7 days or until symptoms have  resolved. If symptoms are not resolved within 10 days patient encouraged to follow-up.      Relevant Medications   mupirocin ointment (BACTROBAN) 2 %   Other Visit Diagnoses     Screening for endocrine, nutritional, metabolic and immunity disorder       Relevant Orders   CBC with Differential/Platelet   Comprehensive metabolic panel   Lipid panel   Hemoglobin A1c   VITAMIN D 25 Hydroxy (Vit-D Deficiency, Fractures)   TSH        Return in about 4 weeks (around 02/02/2021) for VV mood.     Time: 60 minutes, >50% spent counseling, care coordination, chart review, and documentation.   Cathren Sween, Sung Amabile, NP, DNP, AGNP-C [facility] at [facility]  [place]

## 2021-01-05 NOTE — Assessment & Plan Note (Signed)
Significant anxiety and depression symptoms are present today. Positive GAD and PHQ obtained. Patient does have positive answer to question 9 on PHQ however endorses no current plan or intentions upon acting upon these thoughts. She is currently in therapy. Discussed that uncontrolled thyroid could be significantly impacting her mental health and we need to get this under control.  We will obtain labs today.  We will also begin Prozac today and plan to follow-up in 3 to 4 weeks with virtual visit to see how she was doing. If symptoms worsen or thoughts of self-harm return or a plan/intent on self-harm or present please seek emergency care in the behavioral health urgent care for immediate evaluation.

## 2021-01-05 NOTE — Assessment & Plan Note (Signed)
Chart review reveals previous diagnosis of hypothyroidism with Synthroid use. Most recent evaluation 05/03/2014.  It does not appear that patient followed up after that point. Discussed with patient the multitude of symptoms that can present from uncontrolled hypothyroidism and the significance of the condition and importance of medication management for appropriate functioning throughout the body. Will obtain labs today for evaluation and plan to start medication in the near future. At this time will avoid levothyroxine/Synthroid as she has had significant negative thoughts while on this medication.  She did tolerate Armour and NP thyroid well and will consider 1 of these versions for treatment when starting.

## 2021-01-05 NOTE — Assessment & Plan Note (Signed)
Abrasion to the mucous membrane of the left nare. No purulent drainage noted at this time.  Yellow crust scabbing is present over the abrasion. Discussed with patient that we can treat with mupirocin which will be effective against staphylococcal infection if this were to be present.  Recommend applying twice a day for at least 7 days or until symptoms have resolved. If symptoms are not resolved within 10 days patient encouraged to follow-up.

## 2021-01-05 NOTE — Patient Instructions (Signed)
Thank you for choosing [place] [facility] for your Primary Care needs. I am excited for the opportunity to partner with you to meet your health care goals. It was a pleasure meeting you today!  Recommendations from today's visit: We will let you know what your labs show and if we need to make any adjustments based on the results. We will plan to follow-up in 4 weeks with virtual visit for mood.   Information on diet, exercise, and health maintenance recommendations are listed below. This is information to help you be sure you are on track for optimal health and monitoring.   Please look over this and let us know if you have any questions or if you have completed any of the health maintenance outside of [place] so that we can be sure your records are up to date.  ___________________________________________________________ About Me: I am an Adult-Geriatric Nurse Practitioner with a background in caring for patients for more than 20 years with a strong intensive care background. I provide primary care and sports medicine services to patients age 84 and older within this office. My education had a strong focus on caring for the older adult population, which I am passionate about. I am also the director of the APP Fellowship with [facility].   My desire is to provide you with the best service through preventive medicine and supportive care. I consider you a part of the medical team and value your input. I work diligently to ensure that you are heard and your needs are met in a safe and effective manner. I want you to feel comfortable with me as your provider and want you to know that your health concerns are important to me.  For your information, our office hours are: Monday, Tuesday, and Thursday 8:00 AM - 5:00 PM Wednesday and Friday 8:00 AM - 12:00 PM.   In my time away from the office I am teaching new APP's within the system and am unavailable, but my partner, Dr.  Burnard Bunting is in the office for emergent needs.   If you have questions or concerns, please call our office at [phone] or send [place] a MyChart message and we will respond as quickly as possible.  ____________________________________________________________ MyChart:  For all urgent or time sensitive needs we ask that you please call the office to avoid delays. Our number is [phone]. MyChart is not constantly monitored and due to the large volume of messages a day, replies may take up to 72 business hours.  MyChart Policy: MyChart allows for you to see your visit notes, after visit summary, provider recommendations, lab and tests results, make an appointment, request refills, and contact your provider or the office for non-urgent questions or concerns. Providers are seeing patients during normal business hours and do not have built in time to review MyChart messages.  We ask that you allow a minimum of 3 business days for responses to [employer]. For this reason, please do not send urgent requests through [place]. Please call the office at [phone]. New and ongoing conditions may require a visit. We have virtual and in person visit available for your convenience.  Complex MyChart concerns may require a visit. Your provider may request you schedule a virtual or in person visit to ensure we are providing the best care possible. MyChart messages sent after 11:00 AM on Friday will not be received by the provider until Monday morning.    Lab and Test Results: You will  receive your lab and test results on MyChart as soon as they are completed and results have been sent by the lab or testing facility. Due to this service, you will receive your results BEFORE your provider.  I review lab and tests results each morning prior to seeing patients. Some results require collaboration with other providers to ensure you are receiving the most appropriate care. For this reason, we ask that you please  allow a minimum of 3-5 business days from the time the ALL results have been received for your provider to receive and review lab and test results and contact you about these.  Most lab and test result comments from the provider will be sent through [place]. Your provider may recommend changes to the plan of care, follow-up visits, repeat testing, ask questions, or request an office visit to discuss these results. You may reply directly to this message or call the office at [phone] to provide information for the provider or set up an appointment. In some instances, you will be called with test results and recommendations. Please let us know if this is preferred and we will make note of this in your chart to provide this for you.    If you have not heard a response to your lab or test results in 5 business days from all results returning to [place], please call the office to let us know. We ask that you please avoid calling prior to this time unless there is an emergent concern. Due to high call volumes, this can delay the resulting process.  After Hours: For all non-emergency after hours needs, please call the office at [phone] and select the option to reach the on-call provider service. On-call services are shared between multiple [place] offices and therefore it will not be possible to speak directly with your provider. On-call providers may provide medical advice and recommendations, but are unable to provide refills for maintenance medications.  For all emergency or urgent medical needs after normal business hours, we recommend that you seek care at the closest Urgent Care or Emergency Department to ensure appropriate treatment in a timely manner.  [facility] [place] has a 24 hour emergency room located on the ground floor for your convenience.   Urgent Concerns During the Business Day Providers are seeing patients from 8AM to [place] with a busy schedule and are most often  not able to respond to non-urgent calls until the end of the day or the next business day. If you should have URGENT concerns during the day, please call and speak to the nurse or schedule a same day appointment so that we can address your concern without delay.   Thank you, again, for choosing me as your health care partner. I appreciate your trust and look forward to learning more about you.   Tiffany Keeler, DNP, AGNP-c ___________________________________________________________  Health Maintenance Recommendations Screening Testing Mammogram Every 1 -2 years based on history and risk factors Starting at age 73 Pap Smear Ages 21-39 every 3 years Ages 75-65 every 5 years with HPV testing More frequent testing may be required based on results and history Colon Cancer Screening Every 1-10 years based on test performed, risk factors, and history Starting at age 66 Bone Density Screening Every 2-10 years based on history Starting at age 32 for women Recommendations for men differ based on medication usage, history, and risk factors AAA Screening One time ultrasound Men 31-7 years old who have every smoked Lung Cancer Screening Low  Dose Lung CT every 12 months Age 57-80 years with a 30 pack-year smoking history who still smoke or who have quit within the last 15 years  Screening Labs Routine  Labs: Complete Blood Count (CBC), Complete Metabolic Panel (CMP), Cholesterol (Lipid Panel) Every 6-12 months based on history and medications May be recommended more frequently based on current conditions or previous results Hemoglobin A1c Lab Every 3-12 months based on history and previous results Starting at age 38 or earlier with diagnosis of diabetes, high cholesterol, BMI >26, and/or risk factors Frequent monitoring for patients with diabetes to ensure blood sugar control Thyroid Panel (TSH w/ T3 & T4) Every 6 months based on history, symptoms, and risk factors May be repeated more  often if on medication HIV One time testing for all patients 36 and older May be repeated more frequently for patients with increased risk factors or exposure Hepatitis C One time testing for all patients 72 and older May be repeated more frequently for patients with increased risk factors or exposure Gonorrhea, Chlamydia Every 12 months for all sexually active persons 13-24 years Additional monitoring may be recommended for those who are considered high risk or who have symptoms PSA Men 76-79 years old with risk factors Additional screening may be recommended from age 84-69 based on risk factors, symptoms, and history  Vaccine Recommendations Tetanus Booster All adults every 10 years Flu Vaccine All patients 6 months and older every year COVID Vaccine All patients 12 years and older Initial dosing with booster May recommend additional booster based on age and health history HPV Vaccine 2 doses all patients age 2-26 Dosing may be considered for patients over 26 Shingles Vaccine (Shingrix) 2 doses all adults 80 years and older Pneumonia (Pneumovax 23) All adults 12 years and older May recommend earlier dosing based on health history Pneumonia (Prevnar 76) All adults 61 years and older Dosed 1 year after Pneumovax 23  Additional Screening, Testing, and Vaccinations may be recommended on an individualized basis based on family history, health history, risk factors, and/or exposure.  __________________________________________________________  Diet Recommendations for All Patients  I recommend that all patients maintain a diet low in saturated fats, carbohydrates, and cholesterol. While this can be challenging at first, it is not impossible and small changes can make big differences.  Things to try: Decreasing the amount of soda, sweet tea, and/or juice to one or less per day and replace with water While water is always the first choice, if you do not like water you may  consider adding a water additive without sugar to improve the taste other sugar free drinks Replace potatoes with a brightly colored vegetable at dinner Use healthy oils, such as canola oil or olive oil, instead of butter or hard margarine Limit your bread intake to two pieces or less a day Replace regular pasta with low carb pasta options Bake, broil, or grill foods instead of frying Monitor portion sizes  Eat smaller, more frequent meals throughout the day instead of large meals  An important thing to remember is, if you love foods that are not great for your health, you don't have to give them up completely. Instead, allow these foods to be a reward when you have done well. Allowing yourself to still have special treats every once in a while is a nice way to tell yourself thank you for working hard to keep yourself healthy.   Also remember that every day is a new day. If you have a bad day  and "fall off the wagon", you can still climb right back up and keep moving along on your journey!  We have resources available to help you!  Some websites that may be helpful include: www.http://carter.biz/  Www.VeryWellFit.com _____________________________________________________________  Activity Recommendations for All Patients  I recommend that all adults get at least 20 minutes of moderate physical activity that elevates your heart rate at least 5 days out of the week.  Some examples include: Walking or jogging at a pace that allows you to carry on a conversation Cycling (stationary bike or outdoors) Water aerobics Yoga Weight lifting Dancing If physical limitations prevent you from putting stress on your joints, exercise in a pool or seated in a chair are excellent options.  Do determine your MAXIMUM heart rate for activity: YOUR AGE - 220 = MAX HeartRate   Remember! Do not push yourself too hard.  Start slowly and build up your pace, speed, weight, time in exercise, etc.  Allow your body  to rest between exercise and get good sleep. You will need more water than normal when you are exerting yourself. Do not wait until you are thirsty to drink. Drink with a purpose of getting in at least 8, 8 ounce glasses of water a day plus more depending on how much you exercise and sweat.    If you begin to develop dizziness, chest pain, abdominal pain, jaw pain, shortness of breath, headache, vision changes, lightheadedness, or other concerning symptoms, stop the activity and allow your body to rest. If your symptoms are severe, seek emergency evaluation immediately. If your symptoms are concerning, but not severe, please let us know so that we can recommend further evaluation.

## 2021-01-06 ENCOUNTER — Ambulatory Visit (HOSPITAL_BASED_OUTPATIENT_CLINIC_OR_DEPARTMENT_OTHER): Payer: BC Managed Care – PPO

## 2021-01-09 ENCOUNTER — Ambulatory Visit (HOSPITAL_BASED_OUTPATIENT_CLINIC_OR_DEPARTMENT_OTHER): Payer: BC Managed Care – PPO

## 2021-01-09 ENCOUNTER — Other Ambulatory Visit: Payer: Self-pay

## 2021-01-09 DIAGNOSIS — Z13228 Encounter for screening for other metabolic disorders: Secondary | ICD-10-CM | POA: Diagnosis not present

## 2021-01-09 DIAGNOSIS — E039 Hypothyroidism, unspecified: Secondary | ICD-10-CM | POA: Diagnosis not present

## 2021-01-09 DIAGNOSIS — R7989 Other specified abnormal findings of blood chemistry: Secondary | ICD-10-CM | POA: Diagnosis not present

## 2021-01-09 DIAGNOSIS — Z1329 Encounter for screening for other suspected endocrine disorder: Secondary | ICD-10-CM | POA: Diagnosis not present

## 2021-01-09 DIAGNOSIS — R5383 Other fatigue: Secondary | ICD-10-CM | POA: Diagnosis not present

## 2021-01-09 DIAGNOSIS — L659 Nonscarring hair loss, unspecified: Secondary | ICD-10-CM | POA: Diagnosis not present

## 2021-01-09 DIAGNOSIS — Z0000 Encounter for general adult medical examination without abnormal findings: Secondary | ICD-10-CM | POA: Diagnosis not present

## 2021-01-09 DIAGNOSIS — Z1321 Encounter for screening for nutritional disorder: Secondary | ICD-10-CM | POA: Diagnosis not present

## 2021-01-10 LAB — COMPREHENSIVE METABOLIC PANEL
ALT: 19 IU/L (ref 0–32)
AST: 18 IU/L (ref 0–40)
Albumin/Globulin Ratio: 1.4 (ref 1.2–2.2)
Albumin: 4 g/dL (ref 3.8–4.8)
Alkaline Phosphatase: 76 IU/L (ref 44–121)
BUN/Creatinine Ratio: 14 (ref 12–28)
BUN: 11 mg/dL (ref 8–27)
Bilirubin Total: 0.3 mg/dL (ref 0.0–1.2)
CO2: 24 mmol/L (ref 20–29)
Calcium: 8.8 mg/dL (ref 8.7–10.3)
Chloride: 102 mmol/L (ref 96–106)
Creatinine, Ser: 0.78 mg/dL (ref 0.57–1.00)
Globulin, Total: 2.8 g/dL (ref 1.5–4.5)
Glucose: 134 mg/dL — ABNORMAL HIGH (ref 70–99)
Potassium: 4.4 mmol/L (ref 3.5–5.2)
Sodium: 137 mmol/L (ref 134–144)
Total Protein: 6.8 g/dL (ref 6.0–8.5)
eGFR: 86 mL/min/{1.73_m2} (ref >59)

## 2021-01-10 LAB — CBC WITH DIFFERENTIAL/PLATELET
Basophils Absolute: 0.1 10*3/uL (ref 0.0–0.2)
Basos: 1 %
EOS (ABSOLUTE): 0.2 10*3/uL (ref 0.0–0.4)
Eos: 3 %
Hematocrit: 41.1 % (ref 34.0–46.6)
Hemoglobin: 13.7 g/dL (ref 11.1–15.9)
Immature Grans (Abs): 0 10*3/uL (ref 0.0–0.1)
Immature Granulocytes: 0 %
Lymphocytes Absolute: 2 10*3/uL (ref 0.7–3.1)
Lymphs: 32 %
MCH: 28.7 pg (ref 26.6–33.0)
MCHC: 33.3 g/dL (ref 31.5–35.7)
MCV: 86 fL (ref 79–97)
Monocytes Absolute: 0.6 10*3/uL (ref 0.1–0.9)
Monocytes: 9 %
Neutrophils Absolute: 3.6 10*3/uL (ref 1.4–7.0)
Neutrophils: 55 %
Platelets: 244 10*3/uL (ref 150–450)
RBC: 4.77 x10E6/uL (ref 3.77–5.28)
RDW: 13.2 % (ref 11.7–15.4)
WBC: 6.4 10*3/uL (ref 3.4–10.8)

## 2021-01-10 LAB — LIPID PANEL
Chol/HDL Ratio: 4.6 ratio — ABNORMAL HIGH (ref 0.0–4.4)
Cholesterol, Total: 219 mg/dL — ABNORMAL HIGH (ref 100–199)
HDL: 48 mg/dL (ref >39)
LDL Chol Calc (NIH): 146 mg/dL — ABNORMAL HIGH (ref 0–99)
Triglycerides: 141 mg/dL (ref 0–149)
VLDL Cholesterol Cal: 25 mg/dL (ref 5–40)

## 2021-01-10 LAB — VITAMIN D 25 HYDROXY (VIT D DEFICIENCY, FRACTURES): Vit D, 25-Hydroxy: 34.2 ng/mL (ref 30.0–100.0)

## 2021-01-10 LAB — HEMOGLOBIN A1C
Est. average glucose Bld gHb Est-mCnc: 131 mg/dL
Hgb A1c MFr Bld: 6.2 % — ABNORMAL HIGH (ref 4.8–5.6)

## 2021-01-10 LAB — TSH: TSH: 4.45 u[IU]/mL (ref 0.450–4.500)

## 2021-01-11 ENCOUNTER — Telehealth (HOSPITAL_BASED_OUTPATIENT_CLINIC_OR_DEPARTMENT_OTHER): Payer: Self-pay

## 2021-01-11 DIAGNOSIS — R7989 Other specified abnormal findings of blood chemistry: Secondary | ICD-10-CM

## 2021-01-11 DIAGNOSIS — R5383 Other fatigue: Secondary | ICD-10-CM

## 2021-01-11 DIAGNOSIS — L659 Nonscarring hair loss, unspecified: Secondary | ICD-10-CM

## 2021-01-11 NOTE — Telephone Encounter (Signed)
Per Shawna Clamp, DNP add on T3 and T4 added on Orders placed and faxed to [facility] by Elbert Ewings, student MA

## 2021-01-11 NOTE — Telephone Encounter (Signed)
-----   Message from Tollie Eth, NP sent at 01/10/2021  1:58 PM EST ----- Please call pt:  CBC: WNL  CMP: Elevated blood glucose levels. Kidney/liver/electrolytes WNL  Lipid: Elevated total lipids and LDL. Triglycerides and HDL WNL  A1c: Elevated at 6.2- which indicates pre-diabetes.   In the setting of elevated A1c and cholesterol levels, I recommend that she consider starting treatment for these conditions. Both conditions show decreased risks and advancement when control is maintained early. The elevated blood sugar levels can be a significant factor influencing her fatigue. We can schedule a virtual visit to discuss medication options recommended. If she does not want to start medications, we need to at least discuss dietary and lifestyle modifications to help manage this.   Vit D: WNL but on lower end of normal.  If not already taking, recommend Vit D3 2000iU supplement daily to help maintain her levels.   TSH: This is WNL, but higher than it has been in the past. Will add T3 and T4 levels to see if she has a component of subclinical hypothyroidism that is contributing to her symptoms.   OK to add T3 and T4 for elevated TSH, fatigue, hair loss.

## 2021-01-13 LAB — T3, FREE: T3, Free: 3.1 pg/mL (ref 2.0–4.4)

## 2021-01-13 LAB — T4, FREE: Free T4: 0.85 ng/dL (ref 0.82–1.77)

## 2021-01-13 LAB — SPECIMEN STATUS REPORT

## 2021-01-18 ENCOUNTER — Encounter (HOSPITAL_BASED_OUTPATIENT_CLINIC_OR_DEPARTMENT_OTHER): Payer: Self-pay

## 2021-02-02 ENCOUNTER — Ambulatory Visit (INDEPENDENT_AMBULATORY_CARE_PROVIDER_SITE_OTHER): Payer: BC Managed Care – PPO | Admitting: Nurse Practitioner

## 2021-02-02 ENCOUNTER — Encounter (HOSPITAL_BASED_OUTPATIENT_CLINIC_OR_DEPARTMENT_OTHER): Payer: Self-pay | Admitting: Nurse Practitioner

## 2021-02-02 ENCOUNTER — Other Ambulatory Visit: Payer: Self-pay

## 2021-02-02 DIAGNOSIS — F32A Depression, unspecified: Secondary | ICD-10-CM | POA: Diagnosis not present

## 2021-02-02 DIAGNOSIS — F419 Anxiety disorder, unspecified: Secondary | ICD-10-CM

## 2021-02-02 DIAGNOSIS — E039 Hypothyroidism, unspecified: Secondary | ICD-10-CM | POA: Diagnosis not present

## 2021-02-02 MED ORDER — THYROID 90 MG PO TABS: 90.0000 mg | ORAL_TABLET | Freq: Every day | ORAL | 3 refills | Status: DC

## 2021-02-02 MED ORDER — THYROID 60 MG PO TABS: 60.0000 mg | ORAL_TABLET | Freq: Every day | ORAL | 3 refills | Status: DC

## 2021-02-02 NOTE — Assessment & Plan Note (Signed)
Subclinical hypothyroidism with symptoms present.  Restart of NP thyroid at 90mg  dose has been very effective for patient without side effects.  Will plan to recheck her labs in 8 weeks to ensure that this dosing is not too high.  Recommend monitoring for symptoms and report any new symptoms or side effects immediately.

## 2021-02-02 NOTE — Assessment & Plan Note (Signed)
Significant improvement with addition of Prozac 20mg  daily and restart of NP thyroid 90mg .  Sleep and mood improved with decreased obsessive thoughts.  Some depressive thoughts still present- discussed increase in dose vs waiting a few more weeks for full medication effect. She would like to wait.  We will plan to follow-up in 3 months to see how she is doing unless she has the need to increase the dosage sooner.

## 2021-02-02 NOTE — Progress Notes (Signed)
Virtual Visit Encounter telephone visit.   I connected with  Tiffany Schmidt on 02/02/21 at  1:10 PM EST by secure audio and/or video enabled telemedicine application. I verified that I am speaking with the correct person using two identifiers.   I introduced myself as a Publishing rights manager with the practice. The limitations of evaluation and management by telemedicine discussed with the patient and the availability of in person appointments. The patient expressed verbal understanding and consent to proceed.  Participating parties in this visit include: Myself and patient  The patient is: Patient Location: Home I am: Provider Location: Office/Clinic Subjective:    CC and HPI: Tiffany Schmidt is a 60 y.o. year old female presenting for follow up of anxiety and depression.  At her initial visit she was started on Prozac for her symptoms. She was already seeing a therapist for management as well. She also endorses restarting her NP thyroid from her previous provider based on symptoms.   Since starting prozac and NP thyroid she endorses that she is feeling much better. She reports that she is sleeping about 6 hours straight a night when before she would get only 1-2 hours at best.  She reports that she does not feel as anxious or have the repetitive thoughts that she had in the past.  She feels that this medication combination is working well and would like to see how she does for a few more weeks before deciding on dosing changes.   Past medical history, Surgical history, Family history not pertinant except as noted below, Social history, Allergies, and medications have been entered into the medical record, reviewed, and corrections made.   Review of Systems:  All review of systems negative except what is listed in the HPI  Objective:    Alert and oriented x 4 Speaking in clear sentences with no shortness of breath. Voice tremor present (baseline) No distress.  Impression and  Recommendations:    Problem List Items Addressed This Visit     Hypothyroidism - Primary    Subclinical hypothyroidism with symptoms present.  Restart of NP thyroid at 90mg  dose has been very effective for patient without side effects.  Will plan to recheck her labs in 8 weeks to ensure that this dosing is not too high.  Recommend monitoring for symptoms and report any new symptoms or side effects immediately.       Relevant Medications   thyroid (NP THYROID) 90 MG tablet   Anxiety and depression    Significant improvement with addition of Prozac 20mg  daily and restart of NP thyroid 90mg .  Sleep and mood improved with decreased obsessive thoughts.  Some depressive thoughts still present- discussed increase in dose vs waiting a few more weeks for full medication effect. She would like to wait.  We will plan to follow-up in 3 months to see how she is doing unless she has the need to increase the dosage sooner.       Relevant Medications   thyroid (NP THYROID) 90 MG tablet    orders and follow up as documented in EMR, reviewed compliance with lifestyle measures I discussed the assessment and treatment plan with the patient. The patient was provided an opportunity to ask questions and all were answered. The patient agreed with the plan and demonstrated an understanding of the instructions.   The patient was advised to call back or seek an in-person evaluation if the symptoms worsen or if the condition fails to improve as anticipated.  Follow-Up: 8 weeks  for repeat thyroid LAB only. 12 weeks for mood  I provided 30 minutes of non-face-to-face interaction with this non face-to-face encounter including intake, same-day documentation, and chart review.   Tollie Eth, NP , DNP, AGNP-c [facility] Primary [facility] at [facility] [phone] (fax)

## 2021-02-28 ENCOUNTER — Encounter (HOSPITAL_BASED_OUTPATIENT_CLINIC_OR_DEPARTMENT_OTHER): Payer: Self-pay | Admitting: Nurse Practitioner

## 2021-02-28 DIAGNOSIS — F419 Anxiety disorder, unspecified: Secondary | ICD-10-CM

## 2021-02-28 DIAGNOSIS — E039 Hypothyroidism, unspecified: Secondary | ICD-10-CM

## 2021-03-01 MED ORDER — THYROID 90 MG PO TABS: 90.0000 mg | ORAL_TABLET | Freq: Every day | ORAL | 3 refills | Status: DC

## 2021-03-30 DIAGNOSIS — H10413 Chronic giant papillary conjunctivitis, bilateral: Secondary | ICD-10-CM | POA: Diagnosis not present

## 2021-03-30 DIAGNOSIS — H5203 Hypermetropia, bilateral: Secondary | ICD-10-CM | POA: Diagnosis not present

## 2021-06-13 ENCOUNTER — Other Ambulatory Visit: Payer: Self-pay

## 2021-06-13 ENCOUNTER — Ambulatory Visit
Admission: RE | Admit: 2021-06-13 | Discharge: 2021-06-13 | Disposition: A | Discharge status: Home / Self Care | Payer: BC Managed Care – PPO | Source: Ambulatory Visit | Attending: Sports Medicine | Admitting: Sports Medicine

## 2021-06-13 ENCOUNTER — Ambulatory Visit (INDEPENDENT_AMBULATORY_CARE_PROVIDER_SITE_OTHER): Payer: BC Managed Care – PPO | Admitting: Sports Medicine

## 2021-06-13 VITALS — BP 149/61 | Ht 66.0 in | Wt 220.0 lb

## 2021-06-13 DIAGNOSIS — M25531 Pain in right wrist: Secondary | ICD-10-CM

## 2021-06-14 NOTE — Progress Notes (Addendum)
? ?  Subjective:  ? ? Patient ID: Tiffany Schmidt, female    DOB: 1961-01-26, 61 y.o.   MRN: [id number] ? ?HPI chief complaint: Right wrist pain ? ?Patient is a very pleasant 61 year old female that comes in today after having injured her right wrist 1 day ago.  She tripped over a rake while gardening.  She believes that she landed with her right wrist pinned between her chest and the ground.  She is now complaining of pain both along the ulnar and radial aspect of the wrist.  She did notice some mild swelling.  She denies problems with this wrist in the past. ? ?Past medical history reviewed ?Medications reviewed ?Allergies reviewed ? ? ? ?Review of Systems ?As above ?   ?Objective:  ? Physical Exam ? ?Well-developed, well-nourished.  No acute distress ? ?Right wrist: There is a mild amount of swelling over the radial aspect of the distal radius.  No ecchymosis.  Nearly full range of motion although she does have pain with flexion and extension.  She is tender to palpation in the anatomic snuffbox as well as over the scaphoid tubercle.  No tenderness to palpation over the distal ulna nor over the distal radius.  EPL tendon is intact.  No tenderness to palpation along the proximal metacarpals.  No pain with CMC grind.  Good pulses. ? ?X-rays of the right wrist including AP, lateral, and scaphoid views show no obvious fracture.  She does have some degenerative changes at the first [facility] joint. ? ? ?   ?Assessment & Plan:  ? ?Right wrist pain likely secondary to contusion ? ?Although the patient's x-rays are negative, she is tender to palpation in the anatomic snuffbox.  We will immobilize with a thumb spica splint for the next 2 weeks and she will return to the office at the end of that time for reevaluation.  We will repeat x-rays prior to that visit to rule out occult scaphoid fracture.  In the meantime, I recommended that she start icing twice daily.  She may use over-the-counter pain medication as needed too.  Call  with questions or concerns in the interim. ? ?This note was dictated using Dragon naturally speaking software and may contain errors in syntax, spelling, or content which have not been identified prior to signing this note.  ?

## 2021-06-20 ENCOUNTER — Ambulatory Visit (INDEPENDENT_AMBULATORY_CARE_PROVIDER_SITE_OTHER): Payer: BC Managed Care – PPO | Admitting: Nurse Practitioner

## 2021-06-20 ENCOUNTER — Ambulatory Visit
Admission: RE | Admit: 2021-06-20 | Discharge: 2021-06-20 | Disposition: A | Discharge status: Home / Self Care | Payer: BC Managed Care – PPO | Source: Ambulatory Visit | Attending: Sports Medicine | Admitting: Sports Medicine

## 2021-06-20 ENCOUNTER — Encounter (HOSPITAL_BASED_OUTPATIENT_CLINIC_OR_DEPARTMENT_OTHER): Payer: Self-pay | Admitting: Nurse Practitioner

## 2021-06-20 ENCOUNTER — Other Ambulatory Visit (HOSPITAL_BASED_OUTPATIENT_CLINIC_OR_DEPARTMENT_OTHER): Payer: Self-pay | Admitting: Nurse Practitioner

## 2021-06-20 VITALS — BP 117/82 | HR 69 | Ht 66.0 in | Wt 233.0 lb

## 2021-06-20 DIAGNOSIS — R0683 Snoring: Secondary | ICD-10-CM | POA: Diagnosis not present

## 2021-06-20 DIAGNOSIS — W19XXXA Unspecified fall, initial encounter: Secondary | ICD-10-CM

## 2021-06-20 DIAGNOSIS — R0781 Pleurodynia: Secondary | ICD-10-CM

## 2021-06-20 DIAGNOSIS — R6889 Other general symptoms and signs: Secondary | ICD-10-CM

## 2021-06-20 DIAGNOSIS — M25531 Pain in right wrist: Secondary | ICD-10-CM

## 2021-06-20 DIAGNOSIS — Z9109 Other allergy status, other than to drugs and biological substances: Secondary | ICD-10-CM

## 2021-06-20 MED ORDER — LEVOCETIRIZINE DIHYDROCHLORIDE 5 MG PO TABS: 5.0000 mg | ORAL_TABLET | Freq: Every evening | ORAL | 11 refills | Status: DC

## 2021-06-20 NOTE — Progress Notes (Signed)
? ?Acute Office Visit ? ?Subjective:  ? ?  ?Patient ID: Tiffany Schmidt, female    DOB: [birth year], 61 y.o.   MRN: [id number] ? ?Chief Complaint  ?Patient presents with  ? Cough  ? Chest Pain  ? ? ?HPI ?Patient is in today for dry spot in throat and cough for 2 months  ?She has also recent fallen and hit her right side.  ? ?ST/Cough ?- she endorses a dry spot on the right side of the throat that seems to be triggering a cough ?- used to be present only at night, but now happening more often ?- she does not feel like it is in her lungs, but she can feel the need to cough in her throat ?- she thought it was allergies initially ?- she does have a history of terrible allergies ?- she has started using flonase and eye drops ?- she is not coughing anything up, no fevers, chills, shortness of breath, wheezing, chest tightness ? ?Fall ?- She was working in her garden and it was getting dark ?- she accidentally stepped on the rake and her foot slipped off of the rake and she fell onto her side ?- a few days later she tripped on a curb and fell onto her right rib ?- she endorses pain with movement, deep breaths, and coughing ?- it has been 10 days since the second fall ? ?Jaw tightness ?-She tells me that when she is going for a walk or walking up the stairs her jaw feels "tight" ?-She reports that she tries to ignore this ?-She isn't sure if it is on one side, but thinks it is on both ?-She denies dizziness, pain or pressure in the chest, sweating, or nausea ?- she denies cardiovascular hx or Eulonda Andalon cardiovascular death in family ? ?Snoring ?Her husband has told her that she snores at night and sometimes she stops breathing ?She endorses daytime fatigue, waking feeling tired, frequent waking throughout the night ?Has never had evaluation for OSA ? ?ROS ?All review of systems negative except what is listed in the HPI ? ? ?   ?Objective:  ?  ?BP 117/82   Pulse 69   Ht 5\' 6"  (1.676 m)   Wt 233 lb (105.7 kg)   SpO2 97%   BMI  37.61 kg/m?  ?Physical Exam ?Vitals and nursing note reviewed.  ?Constitutional:   ?   Appearance: She is well-developed. She is obese.  ?HENT:  ?   Head: Normocephalic.  ?   Comments: Cobblestone appearance to posterior oropharynx.  Drainage present. ?Eyes:  ?   Extraocular Movements: Extraocular movements intact.  ?   Pupils: Pupils are equal, round, and reactive to light.  ?Neck:  ?   Thyroid: No thyromegaly.  ?   Vascular: No JVD.  ?Cardiovascular:  ?   Rate and Rhythm: Normal rate and regular rhythm. No extrasystoles are present. ?   Chest Wall: PMI is not displaced.  ?   Pulses:     ?     Carotid pulses are 2+ on the right side and 2+ on the left side. ?     Radial pulses are 2+ on the right side and 2+ on the left side.  ?     Dorsalis pedis pulses are 2+ on the right side and 2+ on the left side.  ?   Heart sounds: Normal heart sounds.  ?Pulmonary:  ?   Effort: Pulmonary effort is normal.  ?   Breath sounds: Normal  breath sounds.  ?Chest:  ?   Chest wall: Tenderness present. No mass, deformity, crepitus or edema. There is no dullness to percussion.  ?Abdominal:  ?   General: Bowel sounds are normal.  ?   Palpations: Abdomen is soft.  ?Musculoskeletal:     ?   General: Normal range of motion.  ?   Cervical back: Normal range of motion and neck supple.  ?Lymphadenopathy:  ?   Cervical: No cervical adenopathy.  ?Skin: ?   General: Skin is warm and dry.  ?   Capillary Refill: Capillary refill takes less than 2 seconds.  ?Neurological:  ?   Mental Status: She is alert and oriented to person, place, and time.  ?   Motor: No weakness.  ?Psychiatric:     ?   Mood and Affect: Mood normal.     ?   Behavior: Behavior normal.  ? ? ?No results found for any visits on 06/20/21. ? ? ?   ?Assessment & Plan:  ? ?Problem List Items Addressed This Visit   ? ? Rib pain - Primary  ?  Rib pain following recent fall onto ribs.  Will send patient today for x-ray of the area.  No alarm symptoms present today.  Lung sounds are clear.   Encourage patient to splint the area with coughing and deep breathing and use ice and heat to the area to help with pain.  May also use NSAIDs for pain if needed.  We will follow-up and make changes to plan of care based on findings from x-ray. ? ?  ?  ? Fall  ?  Recent fall onto right side x2.  Both instances were due to tripping over hazards.  No dizziness, syncope, weakness present at time of falls.  Encourage patient to pay close attention to surroundings and avoid sudden movements and walking to help prevent future falls. ? ?  ?  ? Environmental allergies  ?  Sore throat and cough with cobblestoning and drainage present in the posterior oropharynx.  Symptoms consistent with allergy exacerbation.  No fever, chills, infectious symptoms present today.  She has been working in the yard recently.  Discussed with patient that allergies of the season have been particularly difficult to control.  Recommend trial of Xyzal I will and continue use of fluticasone.  Fluticasone is too drying may switch to mometasone nasal spray.  If symptoms worsen or fail to improve please follow-up. ? ?  ?  ? Relevant Medications  ? levocetirizine (XYZAL) 5 MG tablet  ? Habitual snoring  ?  Chronic snoring with witnessed apneic episodes.  Recommend evaluation with sleep study.  We will send referral today to neurology.  We will make changes to plan of care based on findings. ? ?  ?  ? Relevant Orders  ? Ambulatory referral to Neurology  ? Jaw symptom  ?  Symptoms of tightness in the jaw with activity with no associated cardiac or respiratory symptoms present.  At this time etiology is unclear.  Discussed option of cardiology referral with patient.  We will continue to monitor at this time however if the symptom does continue to encourage patient to contact the office and we will send referral for cardiology for further evaluation. ? ?  ?  ? ? ?Meds ordered this encounter  ?Medications  ? levocetirizine (XYZAL) 5 MG tablet  ?  Sig: Take 1  tablet (5 mg total) by mouth every evening.  ?  Dispense:  30 tablet  ?  Refill:  11  ? ? ?Return if symptoms worsen or fail to improve. ? ?Tollie Eth, NP ? ? ?

## 2021-06-20 NOTE — Patient Instructions (Addendum)
You may want to try Rhinocort or Nasocort if the flonase feels like it is too drying. These do not dry out the sinuses as much as the flonase does.  ?I have sent in a medication called Xyzal which is taken at bedtime daily to help with allergies and the drainage down the back of the throat that causes some of the dryness and discomfort.  ? ?I recommend warm salt water gargles for the throat- this will help prevent bacteria from growing and help to rinse out the mucus that is draining. This is best in the morning and at night, but can also be used during the day.  ? ?Your physician recommends that you have an XRAY of your ribs. X-rays are a type of radiation called electromagnetic waves. X-ray imaging creates pictures of the inside of your body. The images show the parts of your body in different shades of black and white. This is because different tissues absorb different amounts of radiation. Calcium in bones absorbs x-rays the most, so bones look white. Fat and other soft tissues absorb less and look gray. Air absorbs the least, so lungs look black ? ?You may have this done at the [facility], located in the [facility] Building on the 1st floor. ?You do no have to have an appointment. ?       [address] [place] ?       [place], [place] [address] ?       [phone] ?       Monday - Friday  8:00 am - 5:00 pm ?[facility] ? ? ?If your symptoms get worse or do not get better, please let me know.  ? ? ? ? ?

## 2021-06-25 DIAGNOSIS — R0781 Pleurodynia: Secondary | ICD-10-CM | POA: Insufficient documentation

## 2021-06-25 DIAGNOSIS — Z9109 Other allergy status, other than to drugs and biological substances: Secondary | ICD-10-CM | POA: Insufficient documentation

## 2021-06-25 DIAGNOSIS — R0683 Snoring: Secondary | ICD-10-CM | POA: Insufficient documentation

## 2021-06-25 DIAGNOSIS — R6889 Other general symptoms and signs: Secondary | ICD-10-CM | POA: Insufficient documentation

## 2021-06-25 DIAGNOSIS — W19XXXA Unspecified fall, initial encounter: Secondary | ICD-10-CM | POA: Insufficient documentation

## 2021-06-25 NOTE — Assessment & Plan Note (Signed)
Sore throat and cough with cobblestoning and drainage present in the posterior oropharynx.  Symptoms consistent with allergy exacerbation.  No fever, chills, infectious symptoms present today.  She has been working in the yard recently.  Discussed with patient that allergies of the season have been particularly difficult to control.  Recommend trial of Xyzal I will and continue use of fluticasone.  Fluticasone is too drying may switch to mometasone nasal spray.  If symptoms worsen or fail to improve please follow-up. ?

## 2021-06-25 NOTE — Assessment & Plan Note (Signed)
Recent fall onto right side x2.  Both instances were due to tripping over hazards.  No dizziness, syncope, weakness present at time of falls.  Encourage patient to pay close attention to surroundings and avoid sudden movements and walking to help prevent future falls. ?

## 2021-06-25 NOTE — Assessment & Plan Note (Signed)
Rib pain following recent fall onto ribs.  Will send patient today for x-ray of the area.  No alarm symptoms present today.  Lung sounds are clear.  Encourage patient to splint the area with coughing and deep breathing and use ice and heat to the area to help with pain.  May also use NSAIDs for pain if needed.  We will follow-up and make changes to plan of care based on findings from x-ray. ?

## 2021-06-25 NOTE — Assessment & Plan Note (Signed)
Chronic snoring with witnessed apneic episodes.  Recommend evaluation with sleep study.  We will send referral today to neurology.  We will make changes to plan of care based on findings. ?

## 2021-06-25 NOTE — Assessment & Plan Note (Signed)
Symptoms of tightness in the jaw with activity with no associated cardiac or respiratory symptoms present.  At this time etiology is unclear.  Discussed option of cardiology referral with patient.  We will continue to monitor at this time however if the symptom does continue to encourage patient to contact the office and we will send referral for cardiology for further evaluation. ?

## 2021-06-27 ENCOUNTER — Ambulatory Visit (INDEPENDENT_AMBULATORY_CARE_PROVIDER_SITE_OTHER): Payer: BC Managed Care – PPO | Admitting: Sports Medicine

## 2021-06-27 VITALS — BP 128/78 | Ht 66.0 in | Wt 220.0 lb

## 2021-06-27 DIAGNOSIS — M25531 Pain in right wrist: Secondary | ICD-10-CM

## 2021-06-27 NOTE — Patient Instructions (Addendum)
?  Try Voltaren gel for your thumb. ? ?Dr. Jordan Likes in [facility] does PRP. ?[facility] [employer] ?[address] ?[place], [place] ?[phone] ?

## 2021-06-28 NOTE — Progress Notes (Signed)
? ?  Subjective:  ? ? Patient ID: Tiffany Schmidt, female    DOB: Jun 25, 1960, 61 y.o.   MRN: [id number] ? ?HPI ? ?Tiffany Schmidt presents today for follow-up on right wrist pain status post FOOSH injury.  She is doing well.  Pain is improving.  Repeat x-rays show no obvious fracture.  She does have some CMC osteoarthritis.  She has been wearing her thumb spica brace for the past 2 weeks. ? ? ? ?Review of Systems ?As above ?   ?Objective:  ? Physical Exam ? ?Well-developed, well-nourished.  No acute distress ? ?Right wrist: Good range of motion.  No effusion.  No soft tissue swelling.  No ecchymosis.  There is no tenderness to palpation in the anatomic snuffbox.  No tenderness over the scaphoid tubercle.  Slight amount of pain over the distal ulna.  Negative CMC grind.  Neurovascularly intact distally. ? ?X-rays are as above ? ? ?   ?Assessment & Plan:  ? ?Right wrist pain likely secondary to contusion ? ?Physical exam and x-rays are reassuring.  She may discontinue her thumb spica brace.  She does have CMC osteoarthritis and she may be interested in PRP.  I provided her with the information for Dr. Cyndia Schmidt office should she wish to discuss this further.  She will follow-up with me as needed. ? ?This note was dictated using Dragon naturally speaking software and may contain errors in syntax, spelling, or content which have not been identified prior to signing this note.  ?

## 2021-07-11 DIAGNOSIS — S90211A Contusion of right great toe with damage to nail, initial encounter: Secondary | ICD-10-CM | POA: Diagnosis not present

## 2021-07-11 DIAGNOSIS — L608 Other nail disorders: Secondary | ICD-10-CM | POA: Diagnosis not present

## 2021-07-11 DIAGNOSIS — Z6835 Body mass index (BMI) 35.0-35.9, adult: Secondary | ICD-10-CM | POA: Diagnosis not present

## 2021-07-11 DIAGNOSIS — L089 Local infection of the skin and subcutaneous tissue, unspecified: Secondary | ICD-10-CM | POA: Diagnosis not present

## 2022-01-15 ENCOUNTER — Other Ambulatory Visit (HOSPITAL_BASED_OUTPATIENT_CLINIC_OR_DEPARTMENT_OTHER): Payer: Self-pay | Admitting: Nurse Practitioner

## 2022-01-15 DIAGNOSIS — F32A Depression, unspecified: Secondary | ICD-10-CM

## 2022-01-15 DIAGNOSIS — F422 Mixed obsessional thoughts and acts: Secondary | ICD-10-CM

## 2022-02-02 DIAGNOSIS — D485 Neoplasm of uncertain behavior of skin: Secondary | ICD-10-CM | POA: Diagnosis not present

## 2022-02-02 DIAGNOSIS — C44529 Squamous cell carcinoma of skin of other part of trunk: Secondary | ICD-10-CM | POA: Diagnosis not present

## 2022-07-20 ENCOUNTER — Other Ambulatory Visit (HOSPITAL_BASED_OUTPATIENT_CLINIC_OR_DEPARTMENT_OTHER): Payer: Self-pay | Admitting: Nurse Practitioner

## 2022-07-20 DIAGNOSIS — E039 Hypothyroidism, unspecified: Secondary | ICD-10-CM

## 2022-07-20 DIAGNOSIS — F419 Anxiety disorder, unspecified: Secondary | ICD-10-CM

## 2022-07-26 ENCOUNTER — Ambulatory Visit (INDEPENDENT_AMBULATORY_CARE_PROVIDER_SITE_OTHER): Payer: 59 | Admitting: Nurse Practitioner

## 2022-07-26 ENCOUNTER — Encounter: Payer: Self-pay | Admitting: Nurse Practitioner

## 2022-07-26 VITALS — BP 124/78 | HR 71 | Wt 231.4 lb

## 2022-07-26 DIAGNOSIS — F419 Anxiety disorder, unspecified: Secondary | ICD-10-CM | POA: Diagnosis not present

## 2022-07-26 DIAGNOSIS — M109 Gout, unspecified: Secondary | ICD-10-CM | POA: Diagnosis not present

## 2022-07-26 DIAGNOSIS — F422 Mixed obsessional thoughts and acts: Secondary | ICD-10-CM | POA: Diagnosis not present

## 2022-07-26 DIAGNOSIS — F32A Depression, unspecified: Secondary | ICD-10-CM

## 2022-07-26 DIAGNOSIS — E039 Hypothyroidism, unspecified: Secondary | ICD-10-CM | POA: Diagnosis not present

## 2022-07-26 MED ORDER — THYROID 90 MG PO TABS: 90.0000 mg | ORAL_TABLET | Freq: Every day | ORAL | 3 refills | Status: DC

## 2022-07-26 MED ORDER — FLUOXETINE HCL 20 MG PO CAPS: 20.0000 mg | ORAL_CAPSULE | Freq: Every day | ORAL | 3 refills | Status: DC

## 2022-07-26 MED ORDER — COLCHICINE 0.6 MG PO TABS: ORAL_TABLET | ORAL | 6 refills | Status: DC

## 2022-07-26 NOTE — Progress Notes (Signed)
Tiffany Eth, DNP, AGNP-c [facility] [address] [place], [place] [address] [phone]  Subjective:   Tiffany Schmidt is a 62 y.o. female presents to day for evaluation of: Knee and Foot Pain Tiffany Schmidt presents today with the chief complaint of knee and foot pain, specifically involving the big toe and two other toes. She reports that she first noticed her knee feeling sore and swollen, which has persisted for at least a month. She had to change the way she gets into bed due to the discomfort. Additionally, she mentions that the joints in two of her toes became sore, and last weekend, her big toe became involved as well, with the entire joint becoming inflamed and making it difficult for her to walk. She denies any falls or twists that could have caused the pain.    She describes the affected toe as appearing swollen with tight skin, raising her suspicion of gout. She reports that her knee feels better but still feels warm to the touch. She has not noticed any new foods in her diet that could potentially trigger a gout flare. The patient is currently taking fluoxetine (Prozac) and Armour Thyroid medication.  PMH, Medications, and Allergies reviewed and updated in chart as appropriate.   ROS negative except for what is listed in HPI. Objective:  BP 124/78   Pulse 71   Wt 231 lb 6.4 oz (105 kg)   BMI 37.35 kg/m  Physical Exam Vitals and nursing note reviewed.  Constitutional:      Appearance: Normal appearance.  Eyes:     Conjunctiva/sclera: Conjunctivae normal.  Musculoskeletal:        General: Swelling and tenderness present.     Right lower leg: No edema.     Left lower leg: No edema.     Comments: Swelling, tenderness, and warmth noted to the right knee and right great toe, second toe, and third toe.   Skin:    General: Skin is warm and dry.     Capillary Refill: Capillary refill takes less than 2 seconds.     Findings: Erythema present.  Neurological:      Mental Status: She is alert and oriented to person, place, and time.     Motor: No weakness.     Gait: Gait abnormal.  Psychiatric:        Behavior: Behavior normal.           Assessment & Plan:   Problem List Items Addressed This Visit     Acute gout of multiple sites - Primary    Patient presents with a history of knee and foot pain, specifically involving the big toe and two other toes. The pain has been present for at least a month, with recent worsening in the big toe. The affected joints appear swollen and inflamed, with no history of trauma or injury. Plan: - Order a uric acid blood test to evaluate for elevated levels suggestive of gout - Prescribe colchicine for the patient to have on hand in case of future flare-ups - Educate the patient on potential dietary triggers and provide informational materials on gout      Relevant Medications   colchicine 0.6 MG tablet   Other Relevant Orders   Uric Acid (Completed)   Hypothyroidism   Relevant Medications   thyroid (NP THYROID) 90 MG tablet   Anxiety and depression   Relevant Medications   thyroid (NP THYROID) 90 MG tablet   FLUoxetine (PROZAC) 20 MG capsule   Mixed  obsessional thoughts and acts   Relevant Medications   FLUoxetine (PROZAC) 20 MG capsule      Tiffany Eth, DNP, AGNP-c 09/17/2022  12:32 AM    History, Medications, Surgery, SDOH, and Family History reviewed and updated as appropriate.

## 2022-07-26 NOTE — Patient Instructions (Signed)
I have sent in the Colchicine for you to have on hand for another flair. Start this immediately if you have any symptoms.  You can take Ibuprofen (800mg  every 8 hours) for your current symptoms, if you would like.

## 2022-07-27 LAB — URIC ACID: Uric Acid: 5.2 mg/dL (ref 3.0–7.2)

## 2022-09-05 ENCOUNTER — Encounter: Payer: Self-pay | Admitting: Nurse Practitioner

## 2022-09-17 DIAGNOSIS — M109 Gout, unspecified: Secondary | ICD-10-CM | POA: Insufficient documentation

## 2022-09-17 NOTE — Assessment & Plan Note (Signed)
Patient presents with a history of knee and foot pain, specifically involving the big toe and two other toes. The pain has been present for at least a month, with recent worsening in the big toe. The affected joints appear swollen and inflamed, with no history of trauma or injury. Plan: - Order a uric acid blood test to evaluate for elevated levels suggestive of gout - Prescribe colchicine for the patient to have on hand in case of future flare-ups - Educate the patient on potential dietary triggers and provide informational materials on gout

## 2022-11-23 ENCOUNTER — Other Ambulatory Visit: Payer: Self-pay | Admitting: Nurse Practitioner

## 2022-11-23 DIAGNOSIS — Z1212 Encounter for screening for malignant neoplasm of rectum: Secondary | ICD-10-CM

## 2022-11-23 DIAGNOSIS — Z1211 Encounter for screening for malignant neoplasm of colon: Secondary | ICD-10-CM

## 2023-04-08 ENCOUNTER — Encounter: Payer: Self-pay | Admitting: Nurse Practitioner

## 2023-04-09 ENCOUNTER — Telehealth: Payer: 59 | Admitting: Physician Assistant

## 2023-04-09 DIAGNOSIS — J3489 Other specified disorders of nose and nasal sinuses: Secondary | ICD-10-CM | POA: Diagnosis not present

## 2023-04-09 MED ORDER — MUPIROCIN 2 % EX OINT: 1.0000 | TOPICAL_OINTMENT | Freq: Two times a day (BID) | CUTANEOUS | 0 refills | Status: DC

## 2023-04-09 NOTE — Progress Notes (Signed)
 E-Visit for Cellulitis  We are sorry that you are not feeling well. Here is how we plan to help!  Based on what you shared with me it looks like you have cellulitis/nasal vestibulitis.  Cellulitis looks like areas of skin redness, swelling, and warmth; it develops as a result of bacteria entering under the skin.   I have prescribed:  Mupirocin cream to apply twice daily for 7 days. If any progressive symptoms despite treatment, please let us know as this would be an indicator that oral antibiotics are needed.  HOME CARE:  Take your medications as ordered and take all of them, even if the skin irritation appears to be healing.   GET HELP RIGHT AWAY IF:  Symptoms that don't begin to go away within 48 hours. Severe redness persists or worsens If the area turns color, spreads or swells. If it blisters and opens, develops yellow-brown crust or bleeds. You develop a fever or chills. If the pain increases or becomes unbearable.  Are unable to keep fluids and food down.  MAKE SURE YOU   Understand these instructions. Will watch your condition. Will get help right away if you are not doing well or get worse.  Thank you for choosing an e-visit.  Your e-visit answers were reviewed by a board certified advanced clinical practitioner to complete your personal care plan. Depending upon the condition, your plan could have included both over the counter or prescription medications.  Please review your pharmacy choice. Make sure the pharmacy is open so you can pick up prescription now. If there is a problem, you may contact your provider through [employer] and have the prescription routed to another pharmacy.  Your safety is important to [place]. If you have drug allergies check your prescription carefully.   For the next 24 hours you can use MyChart to ask questions about today's visit, request a non-urgent call back, or ask for a work or school excuse. You will get an email in the next two days  asking about your experience. I hope that your e-visit has been valuable and will speed your recovery.

## 2023-04-09 NOTE — Progress Notes (Signed)
 I have spent 5 minutes in review of e-visit questionnaire, review and updating patient chart, medical decision making and response to patient.   Piedad Climes, PA-C

## 2023-04-22 IMAGING — CR DG WRIST COMPLETE 3+V*R*
4 series · 4 of 4 positions shown · non-contrast
Comparison: None.

CLINICAL DATA: Acute right wrist pain after fall.

EXAM:
RIGHT WRIST - COMPLETE 3+ VIEW

[x wrist pa right]
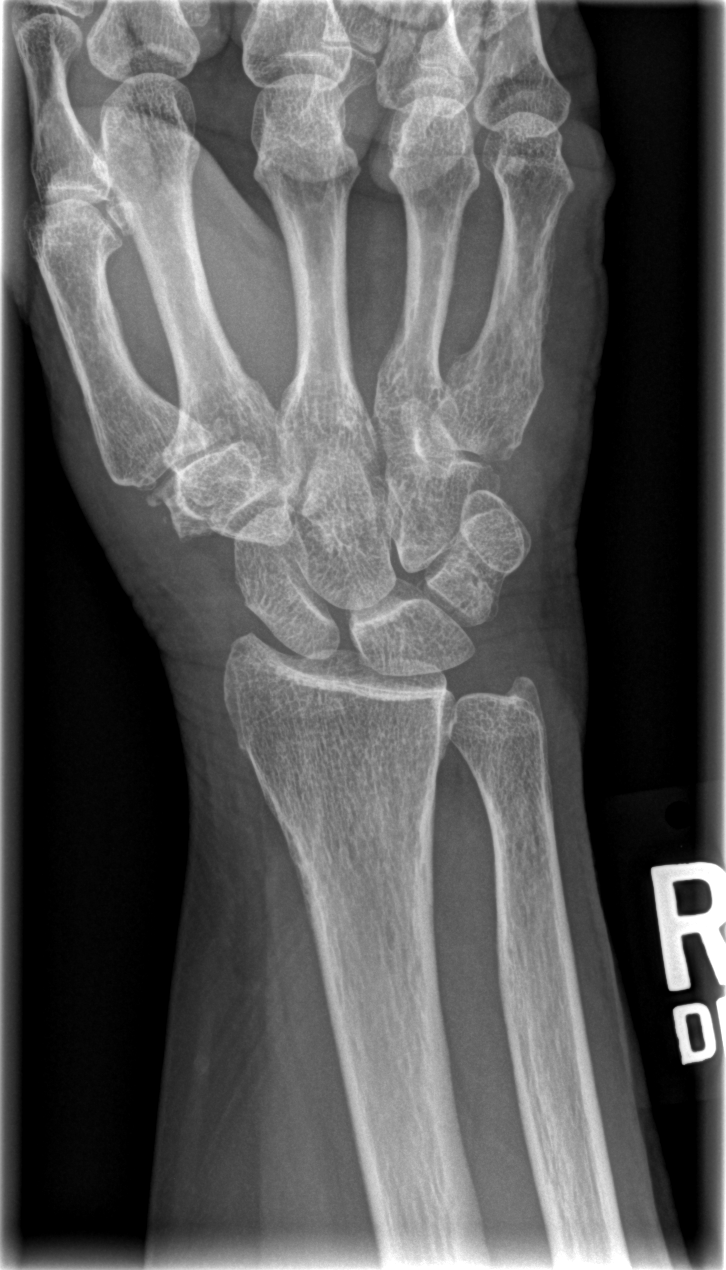

[x wrist obl right]
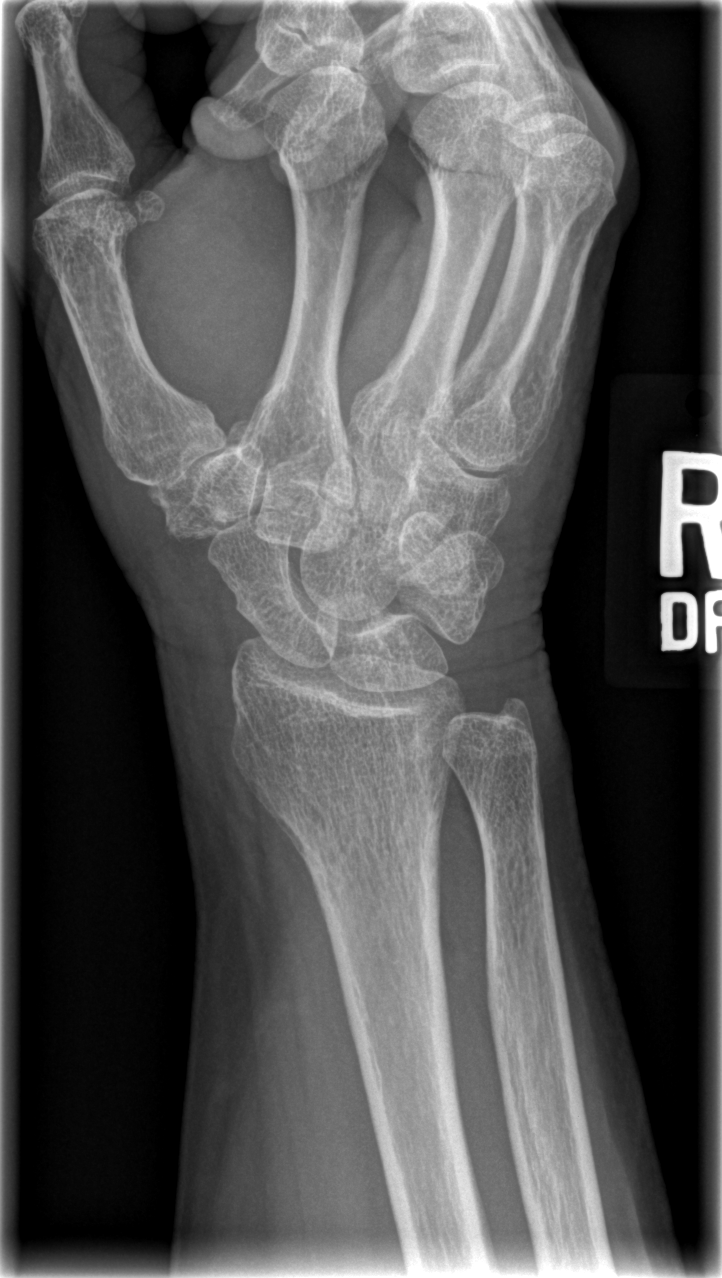

[x wrist lat right]
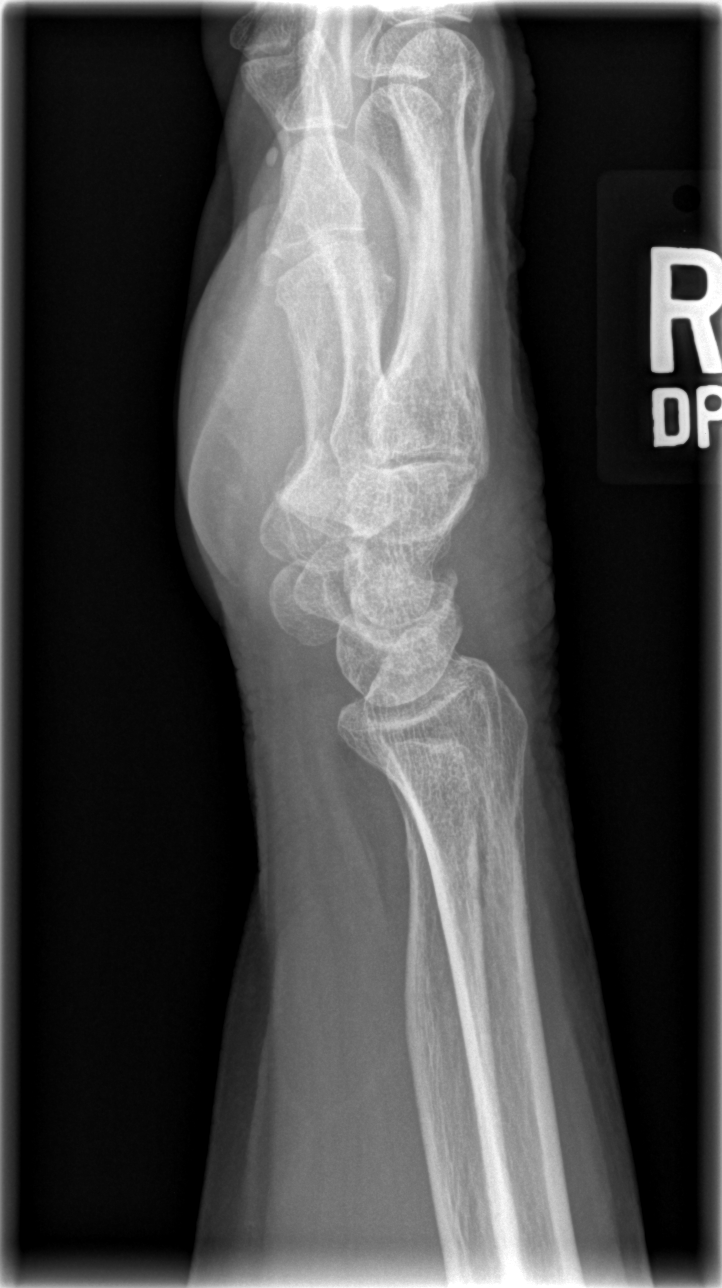

[x navicular]
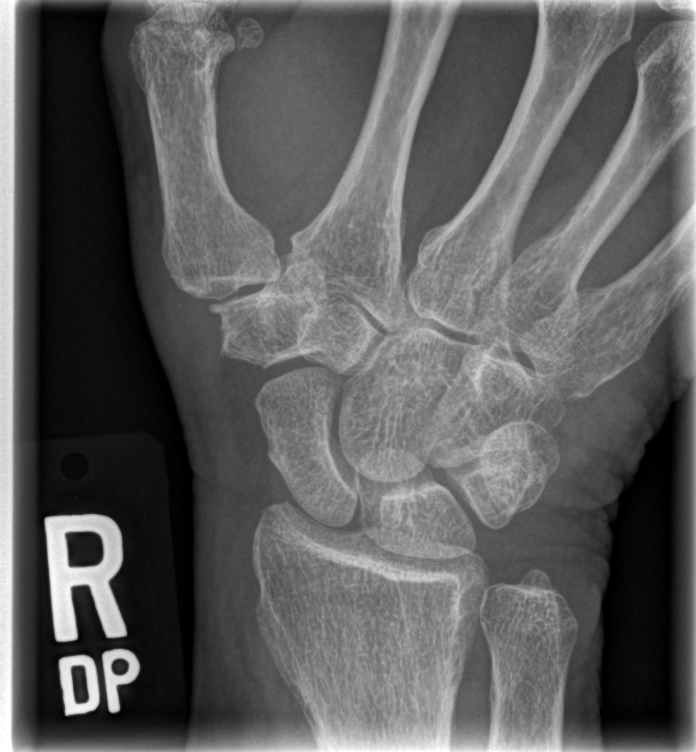

[4 of 4 positions shown; findings below may reference images not displayed]

FINDINGS: There is no evidence of fracture or dislocation. Moderate
degenerative changes seen involving the first carpometacarpal joint.
Soft tissues are unremarkable.
IMPRESSION: Moderate osteoarthritis of the first carpometacarpal joint. No acute
abnormality is noted.

## 2023-04-29 IMAGING — CR DG WRIST COMPLETE 3+V*R*
3 series · 3 of 3 positions shown · non-contrast
Comparison: Right wrist radiographs 06/13/2021

CLINICAL DATA: Right wrist pain.  Fall 1 week earlier.

EXAM:
RIGHT WRIST - COMPLETE 3+ VIEW

[x wrist pa right]
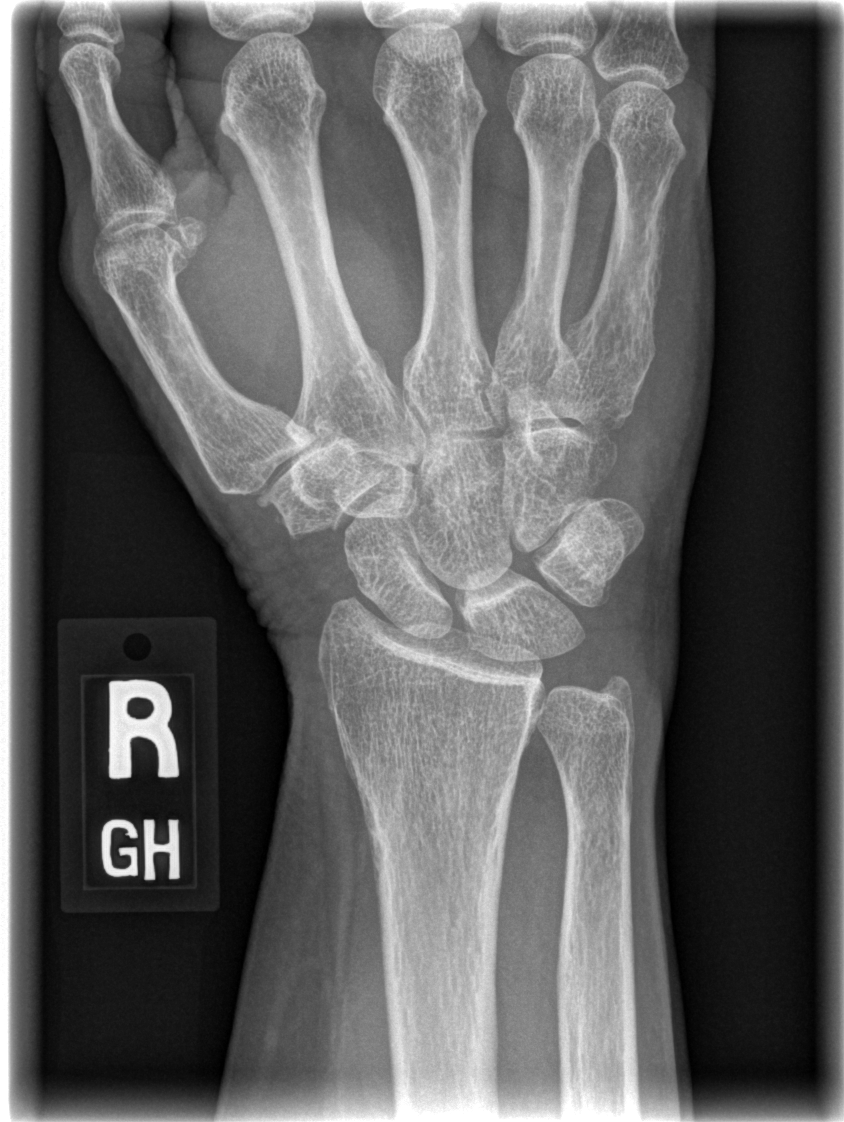

[x wrist lat right]
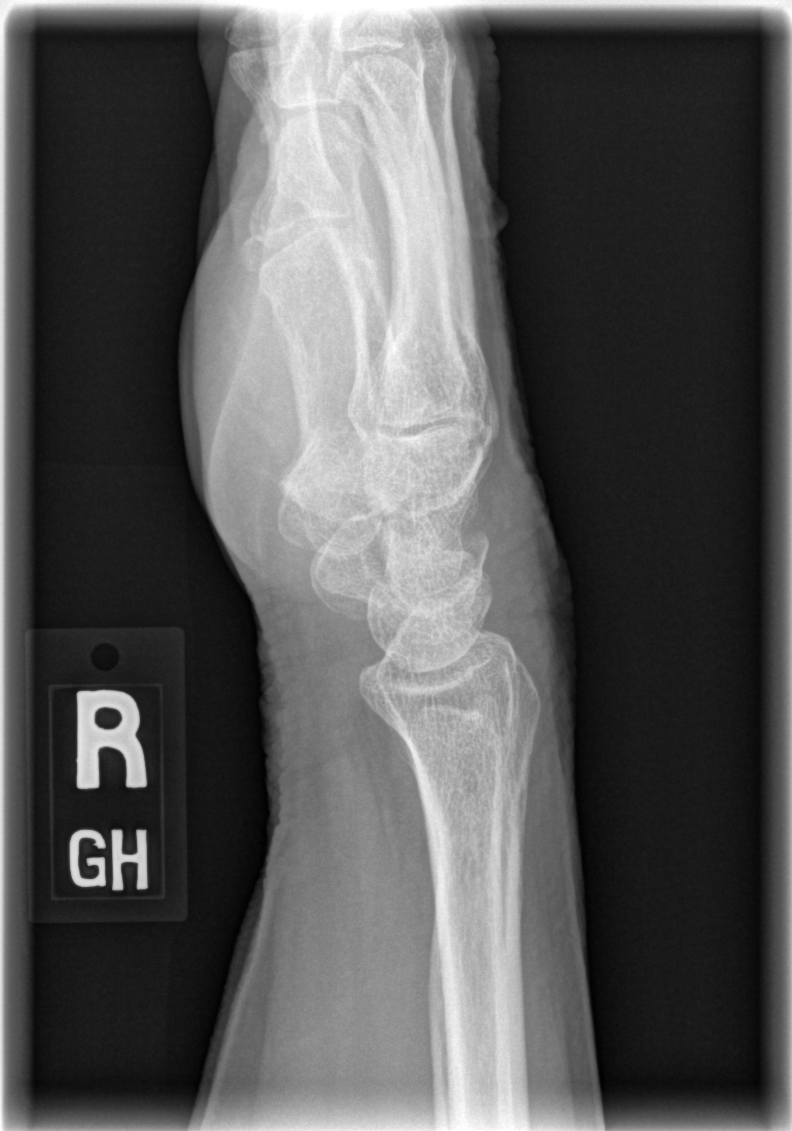

[x navicular]
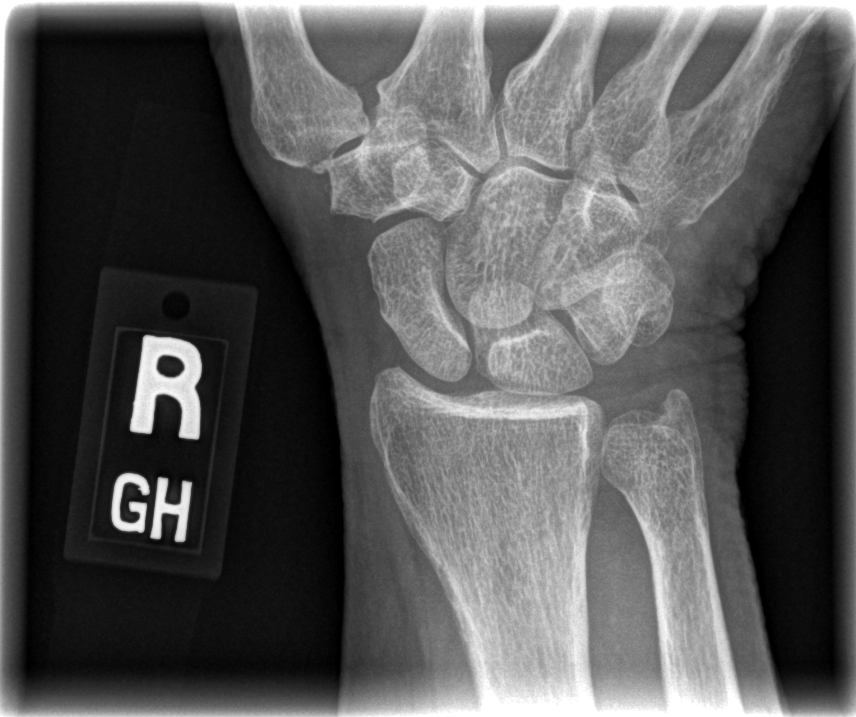

[3 of 3 positions shown; findings below may reference images not displayed]

FINDINGS: Moderate thumb carpometacarpal joint space narrowing with mild
peripheral degenerative osteophytosis. No acute fracture or
dislocation.
IMPRESSION: No significant change from prior. Moderate thumb carpometacarpal
osteoarthritis. No acute fracture.

## 2023-06-25 ENCOUNTER — Other Ambulatory Visit: Payer: Self-pay

## 2023-06-25 ENCOUNTER — Encounter: Payer: Self-pay | Admitting: Nurse Practitioner

## 2023-06-25 DIAGNOSIS — F419 Anxiety disorder, unspecified: Secondary | ICD-10-CM

## 2023-06-25 DIAGNOSIS — E039 Hypothyroidism, unspecified: Secondary | ICD-10-CM

## 2023-06-25 DIAGNOSIS — F422 Mixed obsessional thoughts and acts: Secondary | ICD-10-CM

## 2023-06-25 MED ORDER — FLUOXETINE HCL 20 MG PO CAPS: 20.0000 mg | ORAL_CAPSULE | Freq: Every day | ORAL | 0 refills | Status: DC

## 2023-06-25 MED ORDER — THYROID 90 MG PO TABS: 90.0000 mg | ORAL_TABLET | Freq: Every day | ORAL | 0 refills | Status: DC

## 2023-06-26 ENCOUNTER — Other Ambulatory Visit: Payer: Self-pay

## 2023-06-26 DIAGNOSIS — F419 Anxiety disorder, unspecified: Secondary | ICD-10-CM

## 2023-06-26 DIAGNOSIS — E039 Hypothyroidism, unspecified: Secondary | ICD-10-CM

## 2023-06-26 DIAGNOSIS — F422 Mixed obsessional thoughts and acts: Secondary | ICD-10-CM

## 2023-06-26 MED ORDER — FLUOXETINE HCL 20 MG PO CAPS: 20.0000 mg | ORAL_CAPSULE | Freq: Every day | ORAL | 0 refills | Status: DC

## 2023-06-26 MED ORDER — THYROID 90 MG PO TABS: 90.0000 mg | ORAL_TABLET | Freq: Every day | ORAL | 0 refills | Status: DC

## 2023-08-20 ENCOUNTER — Telehealth: Admitting: Nurse Practitioner

## 2023-08-20 ENCOUNTER — Encounter: Payer: Self-pay | Admitting: Nurse Practitioner

## 2023-08-20 VITALS — Ht 66.0 in | Wt 220.0 lb

## 2023-08-20 DIAGNOSIS — E039 Hypothyroidism, unspecified: Secondary | ICD-10-CM

## 2023-08-20 DIAGNOSIS — F419 Anxiety disorder, unspecified: Secondary | ICD-10-CM | POA: Diagnosis not present

## 2023-08-20 DIAGNOSIS — F422 Mixed obsessional thoughts and acts: Secondary | ICD-10-CM | POA: Diagnosis not present

## 2023-08-20 DIAGNOSIS — E559 Vitamin D deficiency, unspecified: Secondary | ICD-10-CM | POA: Diagnosis not present

## 2023-08-20 DIAGNOSIS — F32A Depression, unspecified: Secondary | ICD-10-CM

## 2023-08-20 DIAGNOSIS — R5383 Other fatigue: Secondary | ICD-10-CM

## 2023-08-20 MED ORDER — FLUOXETINE HCL 40 MG PO CAPS: 40.0000 mg | ORAL_CAPSULE | Freq: Every day | ORAL | 3 refills | Status: AC

## 2023-08-20 NOTE — Progress Notes (Addendum)
 Virtual Visit Encounter mychart visit.   I connected with  Elvie Plater on 08/20/23 at  2:15 PM EDT by secure video and audio telemedicine application. I verified that I am speaking with the correct person using two identifiers.   I introduced myself as a Publishing rights manager with the practice. The limitations of evaluation and management by telemedicine discussed with the patient and the availability of in person appointments. The patient expressed verbal understanding and consent to proceed.  Participating parties in this visit include: Myself and patient  The patient is: Patient Location: Home I am: Provider Location: Office/Clinic  History of Present Illness Amaziah Schmidt is a 63 year old female with depression and hypothyroidism who presents for medication refills and evaluation of symptoms.  She is currently taking Prozac  20 mg for depression. While it is somewhat effective, she continues to experience obsessive-compulsive type thinking. Her therapist has suggested a possible increase in her medication dose.  For hypothyroidism, she is taking NP thyroid . She notices a change in her mental state if she misses doses and frequently feels tired, often taking naps to manage this fatigue. She is unsure of the cause of her fatigue and does not recall when her thyroid  levels were last checked.  She experiences dry skin and hair loss. She attributes her dry skin to not drinking enough water and mentions that her skin appears older than her peers, with more wrinkles, which she attributes to her American [place] heritage. No known issues with her iron levels, as she usually passes iron checks when donating blood.  She uses [facility] on [address] for her pharmacy needs, having switched from [facility] due to stock issues with NP thyroid .  All ROS negative with exception of what is listed above.   Review of Systems:  All review of systems negative except what is listed in the HPI  Objective:     Alert and oriented x 4 Speaking in clear sentences with no shortness of breath. No distress.  Impression and Recommendations:    Problem List Items Addressed This Visit     Vitamin D  deficiency - Primary   Relevant Orders   CBC with Differential/Platelet (Completed)   Comprehensive metabolic panel with GFR (Completed)   Iron, TIBC and Ferritin Panel (Completed)   VITAMIN D  25 Hydroxy (Vit-D Deficiency, Fractures) (Completed)   Hypothyroidism   Relevant Orders   TSH (Completed)   T4, free (Completed)   CBC with Differential/Platelet (Completed)   Comprehensive metabolic panel with GFR (Completed)   Iron, TIBC and Ferritin Panel (Completed)   Anxiety and depression   Relevant Medications   FLUoxetine  (PROZAC ) 40 MG capsule   Mixed obsessional thoughts and acts   Relevant Medications   FLUoxetine  (PROZAC ) 40 MG capsule   Other Visit Diagnoses       Fatigue, unspecified type       Relevant Orders   TSH (Completed)   T4, free (Completed)   CBC with Differential/Platelet (Completed)   Comprehensive metabolic panel with GFR (Completed)   Iron, TIBC and Ferritin Panel (Completed)   VITAMIN D  25 Hydroxy (Vit-D Deficiency, Fractures) (Completed)     Assessment and Plan Obsessive-Compulsive Disorder (OCD) Ongoing obsessive-compulsive thoughts persist despite current fluoxetine  (Prozac ) treatment. A dose increase is recommended by her therapist and agreed upon, as fluoxetine  is effective for these symptoms. She is on a low dose and has recently refilled her prescription. - Increase fluoxetine  dose to two capsules per day. - Send a new prescription for the higher  dose of fluoxetine . - Inform the pharmacy of the dose change to avoid insurance issues. - Monitor her response to the increased fluoxetine  dose and adjust if necessary.  Hypothyroidism She is on NP thyroid  medication and reports fatigue, dry skin, and hair loss, potentially related to thyroid  function. Symptoms worsen  with missed doses. Thyroid  levels have not been checked recently, necessitating reassessment. Fatigue may also relate to mood, sleep quality, or hydration. - Order thyroid  function tests to assess current levels. - Check iron levels to rule out anemia as a cause of fatigue. - Continue current dose of NP thyroid  until lab results are available. - Encourage adequate hydration to potentially improve dry skin.  General Health Maintenance Discussed the impact of ethnicity on skin condition and the importance of hydration. - Encourage adequate hydration to potentially improve dry skin.  Follow-up Plans to follow up on lab results and medication efficacy. - Schedule a lab appointment for thyroid  and iron level testing. - Contact her to arrange a convenient time for lab work.  orders and follow up as documented in EMR I discussed the assessment and treatment plan with the patient. The patient was provided an opportunity to ask questions and all were answered. The patient agreed with the plan and demonstrated an understanding of the instructions.   The patient was advised to call back or seek an in-person evaluation if the symptoms worsen or if the condition fails to improve as anticipated.  Follow-Up: in 6 months  I provided 18 minutes of non-face-to-face interaction with this non face-to-face encounter including intake, same-day documentation, and chart review.   Camie CHARLENA Doing, NP , DNP, AGNP-c [facility]

## 2023-08-21 ENCOUNTER — Other Ambulatory Visit

## 2023-08-21 DIAGNOSIS — E559 Vitamin D deficiency, unspecified: Secondary | ICD-10-CM

## 2023-08-21 DIAGNOSIS — R5383 Other fatigue: Secondary | ICD-10-CM

## 2023-08-21 DIAGNOSIS — E039 Hypothyroidism, unspecified: Secondary | ICD-10-CM

## 2023-08-22 LAB — IRON,TIBC AND FERRITIN PANEL
Ferritin: 50 ng/mL (ref 15–150)
Iron Saturation: 34 (ref 15–55)
Iron: 109 ug/dL (ref 27–139)
Total Iron Binding Capacity: 316 ug/dL (ref 250–450)
UIBC: 207 ug/dL (ref 118–369)

## 2023-08-22 LAB — CBC WITH DIFFERENTIAL/PLATELET
Basophils Absolute: 0 10*3/uL (ref 0.0–0.2)
Basos: 1 %
EOS (ABSOLUTE): 0.2 10*3/uL (ref 0.0–0.4)
Eos: 3 %
Hematocrit: 40.8 % (ref 34.0–46.6)
Hemoglobin: 13.2 g/dL (ref 11.1–15.9)
Immature Grans (Abs): 0 10*3/uL (ref 0.0–0.1)
Immature Granulocytes: 0 %
Lymphocytes Absolute: 2.3 10*3/uL (ref 0.7–3.1)
Lymphs: 35 %
MCH: 28.6 pg (ref 26.6–33.0)
MCHC: 32.4 g/dL (ref 31.5–35.7)
MCV: 89 fL (ref 79–97)
Monocytes Absolute: 0.6 10*3/uL (ref 0.1–0.9)
Monocytes: 9 %
Neutrophils Absolute: 3.4 10*3/uL (ref 1.4–7.0)
Neutrophils: 52 %
Platelets: 237 10*3/uL (ref 150–450)
RBC: 4.61 x10E6/uL (ref 3.77–5.28)
RDW: 13.8 % (ref 11.7–15.4)
WBC: 6.5 10*3/uL (ref 3.4–10.8)

## 2023-08-22 LAB — COMPREHENSIVE METABOLIC PANEL WITH GFR
ALT: 21 IU/L (ref 0–32)
AST: 15 IU/L (ref 0–40)
Albumin: 3.7 g/dL — AB (ref 3.9–4.9)
Alkaline Phosphatase: 66 IU/L (ref 44–121)
BUN/Creatinine Ratio: 20 (ref 12–28)
BUN: 15 mg/dL (ref 8–27)
Bilirubin Total: 0.4 mg/dL (ref 0.0–1.2)
CO2: 22 mmol/L (ref 20–29)
Calcium: 8.8 mg/dL (ref 8.7–10.3)
Chloride: 104 mmol/L (ref 96–106)
Creatinine, Ser: 0.74 mg/dL (ref 0.57–1.00)
Globulin, Total: 2.7 g/dL (ref 1.5–4.5)
Glucose: 106 mg/dL — AB (ref 70–99)
Potassium: 4.5 mmol/L (ref 3.5–5.2)
Sodium: 140 mmol/L (ref 134–144)
Total Protein: 6.4 g/dL (ref 6.0–8.5)
eGFR: 91 mL/min/{1.73_m2} (ref >59)

## 2023-08-22 LAB — VITAMIN D 25 HYDROXY (VIT D DEFICIENCY, FRACTURES): Vit D, 25-Hydroxy: 28.3 ng/mL — AB (ref 30.0–100.0)

## 2023-08-22 LAB — TSH: TSH: 0.012 u[IU]/mL — AB (ref 0.450–4.500)

## 2023-08-22 LAB — T4, FREE: Free T4: 1.03 ng/dL (ref 0.82–1.77)

## 2023-09-11 ENCOUNTER — Ambulatory Visit: Payer: Self-pay | Admitting: Nurse Practitioner

## 2023-09-11 DIAGNOSIS — E039 Hypothyroidism, unspecified: Secondary | ICD-10-CM

## 2023-09-11 DIAGNOSIS — E559 Vitamin D deficiency, unspecified: Secondary | ICD-10-CM

## 2023-09-11 MED ORDER — VITAMIN D (ERGOCALCIFEROL) 1.25 MG (50000 UNIT) PO CAPS
50000.0000 [IU] | ORAL_CAPSULE | ORAL | 3 refills | Status: AC
Start: 2023-09-11 — End: ?

## 2023-11-11 ENCOUNTER — Other Ambulatory Visit: Payer: Self-pay | Admitting: Nurse Practitioner

## 2023-11-11 DIAGNOSIS — F419 Anxiety disorder, unspecified: Secondary | ICD-10-CM

## 2023-11-11 DIAGNOSIS — F422 Mixed obsessional thoughts and acts: Secondary | ICD-10-CM

## 2023-11-11 DIAGNOSIS — E039 Hypothyroidism, unspecified: Secondary | ICD-10-CM

## 2023-11-18 ENCOUNTER — Other Ambulatory Visit: Payer: Self-pay | Admitting: Nurse Practitioner

## 2023-11-18 ENCOUNTER — Other Ambulatory Visit

## 2023-11-18 DIAGNOSIS — E039 Hypothyroidism, unspecified: Secondary | ICD-10-CM

## 2023-11-18 DIAGNOSIS — F422 Mixed obsessional thoughts and acts: Secondary | ICD-10-CM

## 2023-11-18 DIAGNOSIS — F419 Anxiety disorder, unspecified: Secondary | ICD-10-CM

## 2023-11-19 LAB — TSH: TSH: 0.015 u[IU]/mL — ABNORMAL LOW (ref 0.450–4.500)

## 2023-11-19 LAB — T4, FREE: Free T4: 0.96 ng/dL (ref 0.82–1.77)

## 2023-11-20 ENCOUNTER — Encounter: Payer: Self-pay | Admitting: Nurse Practitioner

## 2023-11-21 ENCOUNTER — Other Ambulatory Visit: Payer: Self-pay

## 2023-11-21 DIAGNOSIS — E039 Hypothyroidism, unspecified: Secondary | ICD-10-CM

## 2023-11-21 DIAGNOSIS — F32A Depression, unspecified: Secondary | ICD-10-CM

## 2023-11-21 MED ORDER — THYROID 90 MG PO TABS: 90.0000 mg | ORAL_TABLET | Freq: Every day | ORAL | 0 refills | Status: DC

## 2023-11-22 ENCOUNTER — Ambulatory Visit: Payer: Self-pay | Admitting: Nurse Practitioner

## 2023-11-22 DIAGNOSIS — E039 Hypothyroidism, unspecified: Secondary | ICD-10-CM

## 2023-11-22 DIAGNOSIS — F32A Depression, unspecified: Secondary | ICD-10-CM

## 2023-11-22 MED ORDER — THYROID 90 MG PO TABS: ORAL_TABLET | ORAL | Status: DC

## 2024-02-27 ENCOUNTER — Other Ambulatory Visit: Payer: Self-pay | Admitting: Nurse Practitioner

## 2024-02-27 DIAGNOSIS — E039 Hypothyroidism, unspecified: Secondary | ICD-10-CM

## 2024-02-27 DIAGNOSIS — F419 Anxiety disorder, unspecified: Secondary | ICD-10-CM
